# Patient Record
Sex: Male | Born: 1960 | Race: White | Hispanic: No | Marital: Married | Smoking: Never smoker
Health system: Southern US, Community
[De-identification: ages and names within clinical notes are randomized; demographics above are authoritative.]

## PROBLEM LIST (undated history)

## (undated) DIAGNOSIS — J309 Allergic rhinitis, unspecified: Secondary | ICD-10-CM

## (undated) DIAGNOSIS — M720 Palmar fascial fibromatosis [Dupuytren]: Secondary | ICD-10-CM

## (undated) HISTORY — DX: Allergic rhinitis, unspecified: J30.9

## (undated) HISTORY — DX: Palmar fascial fibromatosis (dupuytren): M72.0

## (undated) HISTORY — PX: KNEE ARTHROSCOPY: SUR90

## (undated) HISTORY — PX: ETHMOIDECTOMY: SHX5197

---

## 2002-11-03 ENCOUNTER — Ambulatory Visit (HOSPITAL_COMMUNITY): Admission: RE | Admit: 2002-11-03 | Discharge: 2002-11-03 | Payer: Self-pay | Admitting: Specialist

## 2002-11-03 ENCOUNTER — Encounter: Payer: Self-pay | Admitting: Specialist

## 2004-12-05 ENCOUNTER — Ambulatory Visit: Payer: Self-pay | Admitting: Unknown Physician Specialty

## 2008-02-02 ENCOUNTER — Ambulatory Visit: Payer: Self-pay | Admitting: Gastroenterology

## 2009-12-10 ENCOUNTER — Ambulatory Visit: Payer: Self-pay | Admitting: Otolaryngology

## 2016-05-12 DIAGNOSIS — J309 Allergic rhinitis, unspecified: Secondary | ICD-10-CM | POA: Insufficient documentation

## 2016-07-15 ENCOUNTER — Telehealth: Payer: Self-pay | Admitting: Family Medicine

## 2016-07-15 NOTE — Telephone Encounter (Signed)
Ok to reestablish: prefer a 45 minute appointment slot

## 2016-07-15 NOTE — Telephone Encounter (Signed)
Please review. KW 

## 2016-07-15 NOTE — Telephone Encounter (Signed)
Okay to re-establish patient

## 2016-07-15 NOTE — Telephone Encounter (Signed)
Spoke with pt and scheduled appt. Thanks TNP

## 2016-07-15 NOTE — Telephone Encounter (Signed)
Pt has not been seen since 03/31/2013.  Pt is having pain in his neck and right arm.  No Rx.  [facility].  Pt has not been seen by any other doctor other than an ENT doctor.  Will you reestablish this pt?  [id number]

## 2016-07-20 ENCOUNTER — Ambulatory Visit
Admission: RE | Admit: 2016-07-20 | Discharge: 2016-07-20 | Disposition: A | Discharge status: Home / Self Care | Payer: 59 | Source: Ambulatory Visit | Attending: Family Medicine | Admitting: Family Medicine

## 2016-07-20 ENCOUNTER — Encounter: Payer: Self-pay | Admitting: Family Medicine

## 2016-07-20 ENCOUNTER — Ambulatory Visit (INDEPENDENT_AMBULATORY_CARE_PROVIDER_SITE_OTHER): Payer: 59 | Admitting: Family Medicine

## 2016-07-20 ENCOUNTER — Telehealth: Payer: Self-pay

## 2016-07-20 VITALS — BP 106/70 | HR 68 | Temp 99.2°F | Resp 15 | Ht 69.0 in | Wt 182.0 lb

## 2016-07-20 DIAGNOSIS — M542 Cervicalgia: Secondary | ICD-10-CM

## 2016-07-20 DIAGNOSIS — M47812 Spondylosis without myelopathy or radiculopathy, cervical region: Secondary | ICD-10-CM | POA: Insufficient documentation

## 2016-07-20 DIAGNOSIS — Z23 Encounter for immunization: Secondary | ICD-10-CM

## 2016-07-20 MED ORDER — MELOXICAM 15 MG PO TABS
15.0000 mg | ORAL_TABLET | Freq: Every day | ORAL | 0 refills | Status: DC
Start: 2016-07-20 — End: 2017-06-14

## 2016-07-20 MED ORDER — CYCLOBENZAPRINE HCL 5 MG PO TABS: 5.0000 mg | ORAL_TABLET | Freq: Three times a day (TID) | ORAL | 1 refills | Status: DC | PRN

## 2016-07-20 NOTE — Telephone Encounter (Signed)
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 Would try prescribed medication for 1-2 weeks. If not improving call for orthopedic referral.

## 2016-07-20 NOTE — Patient Instructions (Signed)
Discussed use of warm compresses for 20 minutes 2 x day. Further f/u pending x-ray results

## 2016-07-20 NOTE — Telephone Encounter (Signed)
[id number] 

## 2016-07-20 NOTE — Progress Notes (Signed)
Subjective:     Patient ID: Derrick Romero, male   DOB: Oct 19, 1961, 55 y.o.   MRN: [id number]  HPI  Chief Complaint  Patient presents with  . Establish Care    Patient comes in office today to re-establish patient care, patient reports that during his absence from the practice he has not had a PCP. Patient would like to address stiff neck and dull sometimes sharp pain on his right arm, patient states that he has difficulties doing simple task such as tying his shoes.   States pain radiates to his first two fingers. Denies injury or Falls. Does mow his lawn with a push mower. Previously working out with weights and walking but stopped a month prior to onset of his sx.   Review of Systems     Objective:   Physical Exam  Constitutional: He appears well-developed and well-nourished. No distress.  Musculoskeletal:  Grips and right shoulder strength 5/5. Shoulder with FROM. Cervical ROM limited in extension and right lateral movement. No specific areas of tenderness but localizes to his right posterior cervical area.       Assessment:    1. Need for influenza vaccination - Flu Vaccine QUAD 36+ mos IM  2. Neck pain on right side - DG Cervical Spine Complete; Future - meloxicam (MOBIC) 15 MG tablet; Take 1 tablet (15 mg total) by mouth daily.  Dispense: 30 tablet; Refill: 0 - cyclobenzaprine (FLEXERIL) 5 MG tablet; Take 1 tablet (5 mg total) by mouth 3 (three) times daily as needed for muscle spasms.  Dispense: 30 tablet; Refill: 1    Plan:    Discussed use of warm compresses.

## 2016-07-22 NOTE — Telephone Encounter (Signed)
Patient has been advised. KW 

## 2017-06-14 ENCOUNTER — Other Ambulatory Visit: Payer: Self-pay | Admitting: Family Medicine

## 2017-06-14 ENCOUNTER — Encounter: Payer: Self-pay | Admitting: Family Medicine

## 2017-06-14 ENCOUNTER — Ambulatory Visit (INDEPENDENT_AMBULATORY_CARE_PROVIDER_SITE_OTHER): Payer: 59 | Admitting: Family Medicine

## 2017-06-14 VITALS — BP 140/80 | HR 57 | Temp 98.1°F | Resp 16 | Wt 204.8 lb

## 2017-06-14 DIAGNOSIS — R05 Cough: Secondary | ICD-10-CM | POA: Diagnosis not present

## 2017-06-14 DIAGNOSIS — R053 Chronic cough: Secondary | ICD-10-CM

## 2017-06-14 NOTE — Patient Instructions (Addendum)
Discussed taking Prevacid 30 mg. daily for two weeks. Phone follow up ok if you are doing well.

## 2017-06-14 NOTE — Progress Notes (Signed)
Subjective:     Patient ID: Derrick Romero, male   DOB: [birth year], 56 y.o.   MRN: [id number]  HPI  Chief Complaint  Patient presents with  . Cough    Patient comes in office today with complaint of non productive cough for the past 3 months. Patient reports difficulty breathing when taking deep breaths but denies any other URI symptoms. Patient has been taking otc antihistamine for cough.   States allergies are under control and not bothering him. Reports being on vacation last week with increase in heartburn. States cough is non-productive. No hx of smoking or asthma.   Review of Systems     Objective:   Physical Exam  Constitutional: He appears well-developed and well-nourished. No distress.  Cardiovascular: Normal rate and regular rhythm.   Pulmonary/Chest: Breath sounds normal. He has no wheezes.  Musculoskeletal: He exhibits no edema (of lower extremities).       Assessment:    1. Chronic cough: ? Mediated by reflux     Plan:    Discussed use of Prevacid 30 mg.daily. F/u in two weeks if not improved for further evaluation.

## 2017-06-28 ENCOUNTER — Ambulatory Visit
Admission: RE | Admit: 2017-06-28 | Discharge: 2017-06-28 | Disposition: A | Discharge status: Home / Self Care | Payer: 59 | Source: Ambulatory Visit | Attending: Family Medicine | Admitting: Family Medicine

## 2017-06-28 ENCOUNTER — Ambulatory Visit (INDEPENDENT_AMBULATORY_CARE_PROVIDER_SITE_OTHER): Payer: 59 | Admitting: Family Medicine

## 2017-06-28 ENCOUNTER — Encounter: Payer: Self-pay | Admitting: Family Medicine

## 2017-06-28 ENCOUNTER — Telehealth: Payer: Self-pay

## 2017-06-28 VITALS — BP 142/84 | HR 60 | Temp 98.8°F | Resp 16 | Wt 204.0 lb

## 2017-06-28 DIAGNOSIS — R05 Cough: Secondary | ICD-10-CM

## 2017-06-28 DIAGNOSIS — R053 Chronic cough: Secondary | ICD-10-CM

## 2017-06-28 MED ORDER — ALBUTEROL SULFATE HFA 108 (90 BASE) MCG/ACT IN AERS: 2.0000 | INHALATION_SPRAY | Freq: Four times a day (QID) | RESPIRATORY_TRACT | 2 refills | Status: DC | PRN

## 2017-06-28 MED ORDER — AZITHROMYCIN 250 MG PO TABS: ORAL_TABLET | ORAL | 0 refills | Status: DC

## 2017-06-28 NOTE — Telephone Encounter (Signed)
-----   Message from Carmon Ginsberg, [place] sent at 06/28/2017  9:13 AM EDT ----- Chest-x-ray clear. Proceed with plan discussed in the office.

## 2017-06-28 NOTE — Progress Notes (Signed)
Subjective:     Patient ID: Derrick Romero, male   DOB: October 15, 1961, 56 y.o.   MRN: [id number]  HPI  Chief Complaint  Patient presents with  . Cough    Patient returns to office today for two week follow up last office visit was 06/14/17. At last visit patient was advised to start Prevacid 30mg , patient reports good compliance on medication but states that the cough has not improved. Patient reports shortness of breath and wheezing.   States cough continues to be intermittent, minimally productive, and not associated with fever, chills, or night sweats. Reports paroxysms of coughing particularly first thing in the AM   Review of Systems     Objective:   Physical Exam  Constitutional: He appears well-developed and well-nourished. No distress.  Cardiovascular: Normal rate and regular rhythm.   Pulmonary/Chest: He has no wheezes.  Inspiratory pop heard  Musculoskeletal: He exhibits no edema (of lower extremities).       Assessment:    1. Chronic cough: no improvement with PPI - DG Chest 2 View; Future - azithromycin (ZITHROMAX Z-PAK) 250 MG tablet; 2 pills the first day then one pill daily  Dispense: 6 each; Refill: 0 - albuterol (PROVENTIL HFA;VENTOLIN HFA) 108 (90 Base) MCG/ACT inhaler; Inhale 2 puffs into the lungs every 6 (six) hours as needed for wheezing or shortness of breath.  Dispense: 1 Inhaler; Refill: 2    Plan:    Further f/u pending x-ray report.

## 2017-06-28 NOTE — Telephone Encounter (Signed)
Patient advised.KW 

## 2017-06-28 NOTE — Patient Instructions (Signed)
We will call you with the x-ray report 

## 2018-01-24 ENCOUNTER — Ambulatory Visit (INDEPENDENT_AMBULATORY_CARE_PROVIDER_SITE_OTHER): Payer: Managed Care, Other (non HMO) | Admitting: Family Medicine

## 2018-01-24 ENCOUNTER — Other Ambulatory Visit: Payer: Self-pay | Admitting: Family Medicine

## 2018-01-24 ENCOUNTER — Encounter: Payer: Self-pay | Admitting: Family Medicine

## 2018-01-24 VITALS — BP 142/100 | HR 64 | Temp 98.8°F | Resp 15 | Wt 198.8 lb

## 2018-01-24 DIAGNOSIS — R1010 Upper abdominal pain, unspecified: Secondary | ICD-10-CM | POA: Diagnosis not present

## 2018-01-24 DIAGNOSIS — R195 Other fecal abnormalities: Secondary | ICD-10-CM

## 2018-01-24 LAB — HEMOCCULT GUIAC POC 1CARD (OFFICE): Fecal Occult Blood, POC: NEGATIVE

## 2018-01-24 NOTE — Progress Notes (Signed)
Subjective:     Patient ID: Derrick Romero, male   DOB: 12-31-1960, 57 y.o.   MRN: [id number] Chief Complaint  Patient presents with  . Diarrhea    Patient comes in office with complaint of loose stools for the past two weeks, patient denies traveling or eating different foods or being around with anyone with similar symptoms. Patient reports that he has had generalized abdominal pain which he describes a dull ache and has noticed that urine has strong odor. Patient states that abdominal pain is more noticable after eating, he denies taking any otc medication to help with symptoms.    HPI States his stools are loose but not watery every AM for > 2 weeks. Reports his normal pattern is one formed stool daily. No recent abx use, travel, otc medication, or stress. Drinks 2 cups of coffee in the AM. Denies family hx of colon cancer or IBD. States he does get upper abdominal pain for 30 minutes after some meals but is normally eating low fat food choices. Abdominal pain usually separate from his loose stools. Had normal colonoscopy in 2009.  Review of Systems     Objective:   Physical Exam  Constitutional: He appears well-developed and well-nourished. No distress.  Abdominal: Soft. There is no tenderness. There is no guarding.  Genitourinary: Prostate normal. Rectal exam shows guaiac negative stool.       Assessment:    1. Pain of upper abdomen - POCT occult blood stool - US Abdomen Complete; Future  2. Loose stools - POCT occult blood stool    Plan:    Evaluate for gall stones. Consider referral to Dr.Wohl as needed.

## 2018-01-24 NOTE — Patient Instructions (Signed)
We will call you with the ultrasound scheduling.

## 2018-01-31 ENCOUNTER — Ambulatory Visit
Admission: RE | Admit: 2018-01-31 | Discharge: 2018-01-31 | Disposition: A | Discharge status: Home / Self Care | Payer: Managed Care, Other (non HMO) | Source: Ambulatory Visit | Attending: Family Medicine | Admitting: Family Medicine

## 2018-01-31 ENCOUNTER — Other Ambulatory Visit: Payer: Self-pay | Admitting: Family Medicine

## 2018-01-31 ENCOUNTER — Telehealth: Payer: Self-pay

## 2018-01-31 DIAGNOSIS — K760 Fatty (change of) liver, not elsewhere classified: Secondary | ICD-10-CM | POA: Insufficient documentation

## 2018-01-31 DIAGNOSIS — R1010 Upper abdominal pain, unspecified: Secondary | ICD-10-CM | POA: Diagnosis not present

## 2018-01-31 DIAGNOSIS — R1011 Right upper quadrant pain: Secondary | ICD-10-CM

## 2018-01-31 DIAGNOSIS — R1012 Left upper quadrant pain: Principal | ICD-10-CM

## 2018-01-31 NOTE — Telephone Encounter (Signed)
Patient advised he states that he will get labs drawn tomorrow. KW

## 2018-01-31 NOTE — Telephone Encounter (Signed)
Patient advised he states that he still has mid abdominal pain that comes and goes and describes this as cramping/pressure feeling in middle of abdomen, patient denies nausea or vomiting. KW

## 2018-01-31 NOTE — Telephone Encounter (Signed)
-----   Message from Derrick Romero, [place] sent at 01/31/2018 10:34 AM EDT ----- Ultrasound ok. No gallstones. Is he still having abdominal pain?

## 2018-01-31 NOTE — Telephone Encounter (Signed)
I have ordered labs to further assess your abdominal pain. Please get them this week at your convenience.

## 2018-02-02 LAB — CBC WITH DIFFERENTIAL/PLATELET
BASOS ABS: 0 10*3/uL (ref 0.0–0.2)
Basos: 1 %
EOS (ABSOLUTE): 0.3 10*3/uL (ref 0.0–0.4)
Eos: 4 %
Hematocrit: 44.3 % (ref 37.5–51.0)
Hemoglobin: 15.1 g/dL (ref 13.0–17.7)
IMMATURE GRANULOCYTES: 0 %
Immature Grans (Abs): 0 10*3/uL (ref 0.0–0.1)
Lymphocytes Absolute: 2.5 10*3/uL (ref 0.7–3.1)
Lymphs: 34 %
MCH: 30.4 pg (ref 26.6–33.0)
MCHC: 34.1 g/dL (ref 31.5–35.7)
MCV: 89 fL (ref 79–97)
MONOS ABS: 0.5 10*3/uL (ref 0.1–0.9)
Monocytes: 8 %
NEUTROS PCT: 53 %
Neutrophils Absolute: 3.9 10*3/uL (ref 1.4–7.0)
PLATELETS: 263 10*3/uL (ref 150–379)
RBC: 4.96 x10E6/uL (ref 4.14–5.80)
RDW: 13.5 % (ref 12.3–15.4)
WBC: 7.2 10*3/uL (ref 3.4–10.8)

## 2018-02-10 ENCOUNTER — Telehealth: Payer: Self-pay | Admitting: Family Medicine

## 2018-02-10 NOTE — Telephone Encounter (Signed)
Patient advised that he needs to have labs drawn, printed out requisitions and placed them  Up front for pick up, patient plans on getting labs drawn this afternoon. KW

## 2018-02-10 NOTE — Telephone Encounter (Signed)
Per [facility] rep patient only turned in one lab requisition for the CBC, will contact patient to get labs drawn . KW

## 2018-02-10 NOTE — Telephone Encounter (Signed)
Patient is wanting lab results from last weeks blood test.

## 2018-02-10 NOTE — Telephone Encounter (Signed)
I see the CBC but no other labs. Could we check with [facility] and see if they were done?

## 2018-02-11 ENCOUNTER — Telehealth: Payer: Self-pay

## 2018-02-11 LAB — COMPREHENSIVE METABOLIC PANEL
ALBUMIN: 4.6 g/dL (ref 3.5–5.5)
ALK PHOS: 66 IU/L (ref 39–117)
ALT: 43 IU/L (ref 0–44)
AST: 30 IU/L (ref 0–40)
Albumin/Globulin Ratio: 1.6 (ref 1.2–2.2)
BILIRUBIN TOTAL: 0.3 mg/dL (ref 0.0–1.2)
BUN / CREAT RATIO: 12 (ref 9–20)
BUN: 14 mg/dL (ref 6–24)
CHLORIDE: 102 mmol/L (ref 96–106)
CO2: 25 mmol/L (ref 20–29)
CREATININE: 1.17 mg/dL (ref 0.76–1.27)
Calcium: 9.9 mg/dL (ref 8.7–10.2)
GFR calc non Af Amer: 70 mL/min/{1.73_m2} (ref >59)
GFR, EST AFRICAN AMERICAN: 81 mL/min/{1.73_m2} (ref >59)
GLOBULIN, TOTAL: 2.9 g/dL (ref 1.5–4.5)
GLUCOSE: 99 mg/dL (ref 65–99)
Potassium: 4.3 mmol/L (ref 3.5–5.2)
SODIUM: 141 mmol/L (ref 134–144)
TOTAL PROTEIN: 7.5 g/dL (ref 6.0–8.5)

## 2018-02-11 LAB — LIPASE: LIPASE: 20 U/L (ref 13–78)

## 2018-02-11 NOTE — Telephone Encounter (Signed)
[id number] 

## 2018-02-11 NOTE — Telephone Encounter (Signed)
Pt returned call ° °teri °

## 2018-02-11 NOTE — Telephone Encounter (Signed)
Patient advised.KW 

## 2018-02-11 NOTE — Telephone Encounter (Signed)
-----   Message from Carmon Ginsberg, [place] sent at 02/11/2018  7:26 AM EDT ----- Liver and pancreas ok. Waiting on results of breath test.

## 2018-02-12 LAB — H. PYLORI BREATH TEST: H pylori Breath Test: NEGATIVE

## 2018-02-14 ENCOUNTER — Other Ambulatory Visit: Payer: Self-pay | Admitting: Family Medicine

## 2018-02-14 ENCOUNTER — Telehealth: Payer: Self-pay | Admitting: Emergency Medicine

## 2018-02-14 DIAGNOSIS — R1010 Upper abdominal pain, unspecified: Secondary | ICD-10-CM

## 2018-02-14 NOTE — Telephone Encounter (Signed)
-----   Message from Carmon Ginsberg, [place] sent at 02/14/2018  7:28 AM EDT ----- Breath test for detecting ulcers is negative. If still having the abdominal discomfort would refer you to a G.I. Specialist. Do you wish to proceed?

## 2018-02-14 NOTE — Telephone Encounter (Signed)
Pt advised. He reports that he is not feeling any better and would like to be referred to GI.

## 2018-02-14 NOTE — Telephone Encounter (Signed)
Referral in progress. 

## 2018-02-16 ENCOUNTER — Encounter: Payer: Self-pay | Admitting: Gastroenterology

## 2018-06-21 ENCOUNTER — Encounter: Payer: Self-pay | Admitting: Family Medicine

## 2018-06-21 ENCOUNTER — Ambulatory Visit: Payer: Managed Care, Other (non HMO) | Admitting: Family Medicine

## 2018-06-21 VITALS — BP 110/70 | HR 68 | Temp 98.5°F | Resp 16 | Wt 198.8 lb

## 2018-06-21 DIAGNOSIS — H6122 Impacted cerumen, left ear: Secondary | ICD-10-CM | POA: Diagnosis not present

## 2018-06-21 DIAGNOSIS — J301 Allergic rhinitis due to pollen: Secondary | ICD-10-CM

## 2018-06-21 NOTE — Progress Notes (Signed)
  Subjective:     Patient ID: Derrick Romero, male   DOB: 03/31/1961, 57 y.o.   MRN: [id number] Chief Complaint  Patient presents with  . Hearing Problem    Patient comes in office today with reports of ear congestion in his left ear for the past two weeks. Patient reports symptoms of post nasal drop, sinus pain/pressure. Patient reports placing wax softner in ear and taking otc allergy pill and nasal spray for relief.    HPI States he has been treating his allergies with multiple medications including steroid nasal spray with little improvement in his left ear congestion. Reports sinus drainage is primarily clear. States his ear got worse two week ago when he dove into the water while at the beach.  Review of Systems     Objective:   Physical Exam  Constitutional: He appears well-developed and well-nourished. No distress.  Ears: R TM intact without inflammation. Left ear canal with a cerumen impaction. After irrigation per Kat: Ear canal is patent and T.M is intact. Old scarring is noted. Throat: no tonsillar enlargement or exudate Neck: no cervical adenopathy Lungs: clear     Assessment:    1. Seasonal allergic rhinitis due to pollen  2. Cerumen debris on tympanic membrane of left ear: resolved - EAR CERUMEN REMOVAL    Plan:    Further f/u f/u prn purulent sinus drainage or increased pressure.

## 2018-06-21 NOTE — Patient Instructions (Signed)
Let me know if you develop increased yellow or green sinus drainage/pressure despite use of allergy medication as you are doing.

## 2018-07-28 ENCOUNTER — Ambulatory Visit (INDEPENDENT_AMBULATORY_CARE_PROVIDER_SITE_OTHER): Payer: Managed Care, Other (non HMO) | Admitting: Family Medicine

## 2018-07-28 ENCOUNTER — Encounter: Payer: Self-pay | Admitting: Family Medicine

## 2018-07-28 VITALS — BP 116/82 | HR 84 | Temp 98.0°F | Wt 193.2 lb

## 2018-07-28 DIAGNOSIS — J40 Bronchitis, not specified as acute or chronic: Secondary | ICD-10-CM

## 2018-07-28 DIAGNOSIS — M7020 Olecranon bursitis, unspecified elbow: Secondary | ICD-10-CM | POA: Insufficient documentation

## 2018-07-28 MED ORDER — PREDNISONE 20 MG PO TABS: 40.0000 mg | ORAL_TABLET | Freq: Every day | ORAL | 0 refills | Status: AC

## 2018-07-28 MED ORDER — AZITHROMYCIN 250 MG PO TABS: ORAL_TABLET | ORAL | 0 refills | Status: DC

## 2018-07-28 NOTE — Patient Instructions (Signed)

## 2018-07-28 NOTE — Progress Notes (Signed)
Patient: Derrick Romero Male    DOB: [birth year]   57 y.o.   MRN: [id number] Visit Date: 07/28/2018  Today's Provider: Lavon Paganini, MD   Chief Complaint  Patient presents with  . URI   Subjective:    I, Tiburcio Pea, CMA, am acting as a scribe for Lavon Paganini, MD.   URI   This is a new problem. Episode onset: 12 days ago. The problem has been unchanged. There has been no fever. Associated symptoms include congestion, coughing, headaches, rhinorrhea, sneezing and wheezing. Pertinent negatives include no sinus pain or sore throat. Associated symptoms comments: SOB . Treatments tried: Mucinex and Tylenol PM for sleep  The treatment provided mild relief.   Has been using albuterol inhaler 3-4 times daily that seems to knock the edge off.  He is most concerned about the shortness of breath, coughing, and wheezing.  It is been worsening over the last 12 days.  He has had bronchitis previously and states this feels similar.    No Known Allergies   Current Outpatient Medications:  .  albuterol (PROVENTIL HFA;VENTOLIN HFA) 108 (90 Base) MCG/ACT inhaler, Inhale 2 puffs into the lungs every 6 (six) hours as needed for wheezing or shortness of breath., Disp: 1 Inhaler, Rfl: 2 .  Misc Natural Products (GLUCOSAMINE CHONDROITIN ADV) TABS, Take by mouth., Disp: , Rfl:   Review of Systems  Constitutional: Negative.   HENT: Positive for congestion, rhinorrhea and sneezing. Negative for sinus pain and sore throat.   Respiratory: Positive for cough and wheezing.   Cardiovascular: Negative.   Musculoskeletal: Negative.   Neurological: Positive for headaches.    Social History   Tobacco Use  . Smoking status: Never Smoker  . Smokeless tobacco: Never Used  Substance Use Topics  . Alcohol use: Yes    Comment: occasional   Objective:   BP 116/82 (BP Location: Left Arm, Patient Position: Sitting, Cuff Size: Large)   Pulse 84   Temp 98 F (36.7 C) (Oral)   Wt 193 lb 3.2 oz  (87.6 kg)   SpO2 95%   BMI 28.53 kg/m  Vitals:   07/28/18 1049  BP: 116/82  Pulse: 84  Temp: 98 F (36.7 C)  TempSrc: Oral  SpO2: 95%  Weight: 193 lb 3.2 oz (87.6 kg)     Physical Exam  Constitutional: He is oriented to person, place, and time. He appears well-developed and well-nourished. No distress.  HENT:  Head: Normocephalic and atraumatic.  Right Ear: Tympanic membrane, external ear and ear canal normal.  Left Ear: Tympanic membrane, external ear and ear canal normal.  Nose: Mucosal edema present. Right sinus exhibits no maxillary sinus tenderness and no frontal sinus tenderness. Left sinus exhibits no maxillary sinus tenderness and no frontal sinus tenderness.  Mouth/Throat: Uvula is midline, oropharynx is clear and moist and mucous membranes are normal.  Eyes: Pupils are equal, round, and reactive to light. Conjunctivae are normal. Right eye exhibits no discharge. Left eye exhibits no discharge. No scleral icterus.  Neck: Neck supple. No thyromegaly present.  Cardiovascular: Normal rate, regular rhythm, normal heart sounds and intact distal pulses.  No murmur heard. Pulmonary/Chest: Effort normal. No respiratory distress. He has wheezes (diffusely). He has rales (worse in bases).  Musculoskeletal: He exhibits no edema.  Lymphadenopathy:    He has no cervical adenopathy.  Neurological: He is alert and oriented to person, place, and time.  Skin: Skin is warm and dry. Capillary refill takes less than  2 seconds. No rash noted.  Psychiatric: He has a normal mood and affect. His behavior is normal.  Vitals reviewed.       Assessment & Plan:   1. Bronchitis - Symptoms and exam consistent with bronchitis -Given that lung exam is symmetric, doubt pneumonia -No indication for chest x-ray at this time -We will treat with prednisone and azithromycin -Can continue albuterol as needed as well as over-the-counter medications for symptom management -Discussed strict return  precautions   Meds ordered this encounter  Medications  . azithromycin (ZITHROMAX) 250 MG tablet    Sig: Take 500mg  x1 dose, then take 250mg  daily x4 d    Dispense:  6 each    Refill:  0  . predniSONE (DELTASONE) 20 MG tablet    Sig: Take 2 tablets (40 mg total) by mouth daily with breakfast for 5 days.    Dispense:  10 tablet    Refill:  0     Return if symptoms worsen or fail to improve.   The entirety of the information documented in the History of Present Illness, Review of Systems and Physical Exam were personally obtained by me. Portions of this information were initially documented by Tiburcio Pea, CMA and reviewed by me for thoroughness and accuracy.    Virginia Crews, MD, MPH [facility] 07/28/2018 11:06 AM

## 2018-12-26 ENCOUNTER — Ambulatory Visit: Payer: Managed Care, Other (non HMO) | Admitting: Family Medicine

## 2018-12-26 ENCOUNTER — Encounter: Payer: Self-pay | Admitting: Family Medicine

## 2018-12-26 VITALS — BP 120/84 | HR 91 | Temp 98.6°F | Resp 15 | Wt 171.2 lb

## 2018-12-26 DIAGNOSIS — Z1211 Encounter for screening for malignant neoplasm of colon: Secondary | ICD-10-CM

## 2018-12-26 DIAGNOSIS — Z13220 Encounter for screening for lipoid disorders: Secondary | ICD-10-CM

## 2018-12-26 DIAGNOSIS — R350 Frequency of micturition: Secondary | ICD-10-CM

## 2018-12-26 DIAGNOSIS — R6882 Decreased libido: Secondary | ICD-10-CM | POA: Diagnosis not present

## 2018-12-26 DIAGNOSIS — J309 Allergic rhinitis, unspecified: Secondary | ICD-10-CM

## 2018-12-26 DIAGNOSIS — Z131 Encounter for screening for diabetes mellitus: Secondary | ICD-10-CM | POA: Diagnosis not present

## 2018-12-26 DIAGNOSIS — Z1159 Encounter for screening for other viral diseases: Secondary | ICD-10-CM | POA: Diagnosis not present

## 2018-12-26 DIAGNOSIS — L301 Dyshidrosis [pompholyx]: Secondary | ICD-10-CM

## 2018-12-26 DIAGNOSIS — Z125 Encounter for screening for malignant neoplasm of prostate: Secondary | ICD-10-CM

## 2018-12-26 LAB — POCT URINALYSIS DIPSTICK
Bilirubin, UA: NEGATIVE
Blood, UA: NEGATIVE
Glucose, UA: NEGATIVE
Ketones, UA: NEGATIVE
LEUKOCYTES UA: NEGATIVE
NITRITE UA: NEGATIVE
PROTEIN UA: NEGATIVE
Spec Grav, UA: <=1.005 — AB (ref 1.010–1.025)
Urobilinogen, UA: NEGATIVE E.U./dL — AB
pH, UA: 6.5 (ref 5.0–8.0)

## 2018-12-26 LAB — POCT GLYCOSYLATED HEMOGLOBIN (HGB A1C): HEMOGLOBIN A1C: 6 % — AB (ref 4.0–5.6)

## 2018-12-26 NOTE — Patient Instructions (Addendum)
We will call you with the lab results and colonoscopy. Do get the flu shot and update your tetanus shot (Td in 2007). Discussed using moisturizing cream mixed in equal amounts with hydrocortisone cream daily for your hands.

## 2018-12-26 NOTE — Progress Notes (Signed)
  Subjective:     Patient ID: Derrick Romero, male   DOB: 04-07-1961, 58 y.o.   MRN: [id number] Chief Complaint  Patient presents with  . Urinary Frequency    Patient comes in office today with complaints of urinary frequency for the past 3 weeks. Paitent reportes that he has noticed signs of increased thirst and back pain.    HPI After further history he has nocturia x one. Difficult for him to stay on subject and needs to be redirected. Basically we have decided to perform health maintenance labs and will update vaccinations at a later date after he gets his  Dupuytren's injections. Currently works as an Lobbyist.  Review of Systems  HENT:       States he currently is controlling his allergies with Flonase, allegra, and saline irrigations.  Genitourinary:       Reported decreased libido but no erectile dysfunction  Skin:       Has dry skin with fissuring on his fingers  Psychiatric/Behavioral: Positive for sleep disturbance (takes benadryl at times to help him sleep).       Objective:   Physical Exam Constitutional:      General: He is not in acute distress. Cardiovascular:     Rate and Rhythm: Normal rate and regular rhythm.  Pulmonary:     Breath sounds: Wheezes:  transient wheezes in his posterior lung fields.  Neurological:     Mental Status: He is alert.        Assessment:    1. Urinary frequency - POCT urinalysis dipstick - POCT glycosylated hemoglobin (Hb A1C)  2. Decreased libido - Testosterone  3. Screening for diabetes mellitus - Comprehensive metabolic panel  4. Encounter for hepatitis C screening test for low risk patient - Hepatitis C Antibody  5. Screening for prostate cancer - PSA  6. Allergic rhinitis, unspecified seasonality, unspecified trigger  7. Screening for colon cancer - Ambulatory referral to Gastroenterology  8. Screening for cholesterol level - Lipid panel  9. Dyshidrotic eczema    Plan:    Further f/u pending lab  results. Update flu and Tetanus shot. Use moisturizing cream mixed with hydrocortisone cream for his eczema.

## 2018-12-27 ENCOUNTER — Other Ambulatory Visit: Payer: Self-pay | Admitting: Family Medicine

## 2018-12-28 ENCOUNTER — Other Ambulatory Visit: Payer: Self-pay

## 2018-12-28 ENCOUNTER — Telehealth: Payer: Self-pay | Admitting: Gastroenterology

## 2018-12-28 DIAGNOSIS — Z1211 Encounter for screening for malignant neoplasm of colon: Secondary | ICD-10-CM

## 2018-12-28 LAB — COMPREHENSIVE METABOLIC PANEL
ALK PHOS: 60 IU/L (ref 39–117)
ALT: 39 IU/L (ref 0–44)
AST: 50 IU/L — ABNORMAL HIGH (ref 0–40)
Albumin/Globulin Ratio: 1.6 (ref 1.2–2.2)
Albumin: 4.5 g/dL (ref 3.8–4.9)
BUN/Creatinine Ratio: 16 (ref 9–20)
BUN: 17 mg/dL (ref 6–24)
Bilirubin Total: 0.6 mg/dL (ref 0.0–1.2)
CO2: 22 mmol/L (ref 20–29)
Calcium: 9.7 mg/dL (ref 8.7–10.2)
Chloride: 102 mmol/L (ref 96–106)
Creatinine, Ser: 1.07 mg/dL (ref 0.76–1.27)
GFR calc Af Amer: 90 mL/min/{1.73_m2} (ref >59)
GFR calc non Af Amer: 78 mL/min/{1.73_m2} (ref >59)
Globulin, Total: 2.9 g/dL (ref 1.5–4.5)
Glucose: 107 mg/dL — ABNORMAL HIGH (ref 65–99)
POTASSIUM: 4.6 mmol/L (ref 3.5–5.2)
Sodium: 138 mmol/L (ref 134–144)
Total Protein: 7.4 g/dL (ref 6.0–8.5)

## 2018-12-28 LAB — HEPATITIS C ANTIBODY: Hep C Virus Ab: <0.1 s/co ratio (ref 0.0–0.9)

## 2018-12-28 LAB — PSA: PROSTATE SPECIFIC AG, SERUM: 1.2 ng/mL (ref 0.0–4.0)

## 2018-12-28 LAB — LIPID PANEL
Chol/HDL Ratio: 3.7 ratio (ref 0.0–5.0)
Cholesterol, Total: 219 mg/dL — ABNORMAL HIGH (ref 100–199)
HDL: 59 mg/dL (ref >39)
LDL Calculated: 143 mg/dL — ABNORMAL HIGH (ref 0–99)
Triglycerides: 87 mg/dL (ref 0–149)
VLDL Cholesterol Cal: 17 mg/dL (ref 5–40)

## 2018-12-28 LAB — TESTOSTERONE: Testosterone: 637 ng/dL (ref 264–916)

## 2018-12-28 NOTE — Telephone Encounter (Signed)
LVM for pt returning call to schedule colonoscopy.  Thanks [employer]

## 2018-12-28 NOTE — Telephone Encounter (Signed)
Pt returned a call to schedule procedure

## 2019-01-01 IMAGING — US US ABDOMEN COMPLETE
2 series · 14 of 25 positions shown · non-contrast
Comparison: None.

CLINICAL DATA: Abdominal pain for 1 month

EXAM:
ABDOMEN ULTRASOUND COMPLETE

[Series 1: us abdomen complete · 0.17mm/px · 13 of 91 slices shown (1 of 2)]
[im 1/91]
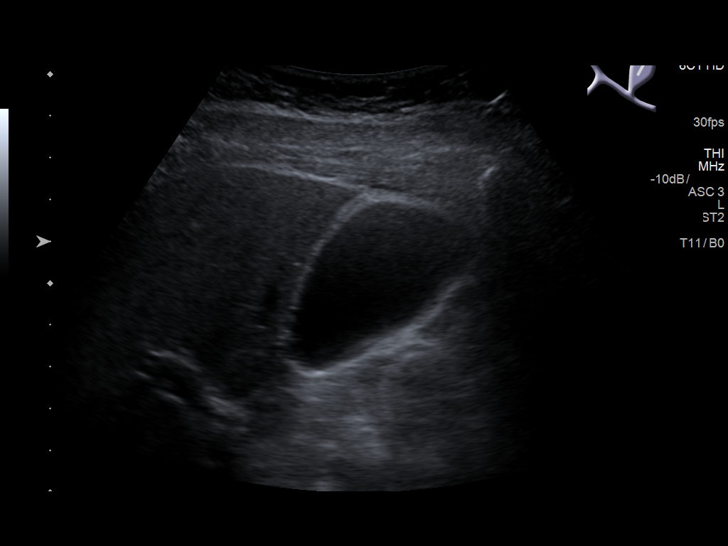
[im 8/91]
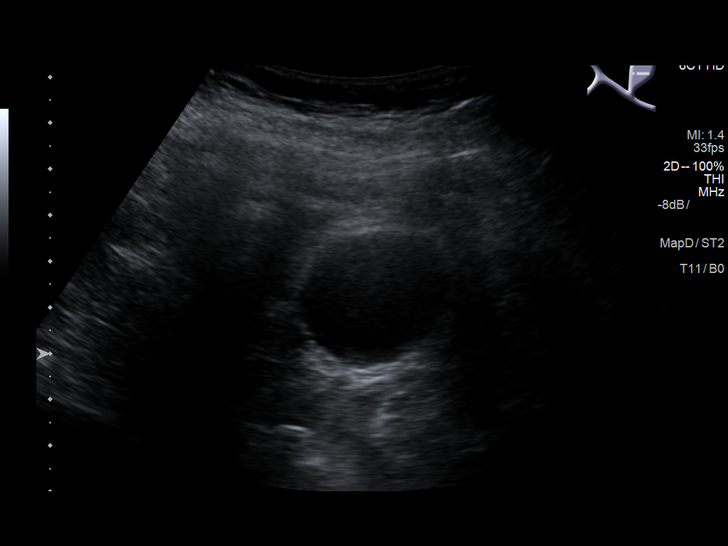
[im 16/91]
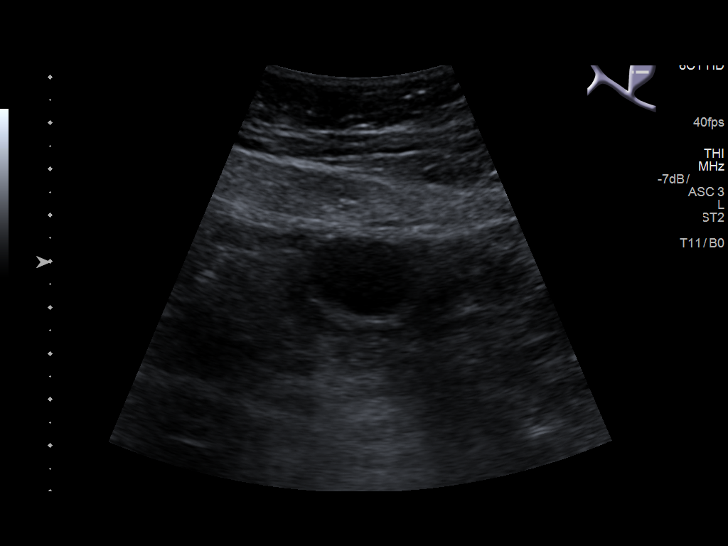
[im 24/91]
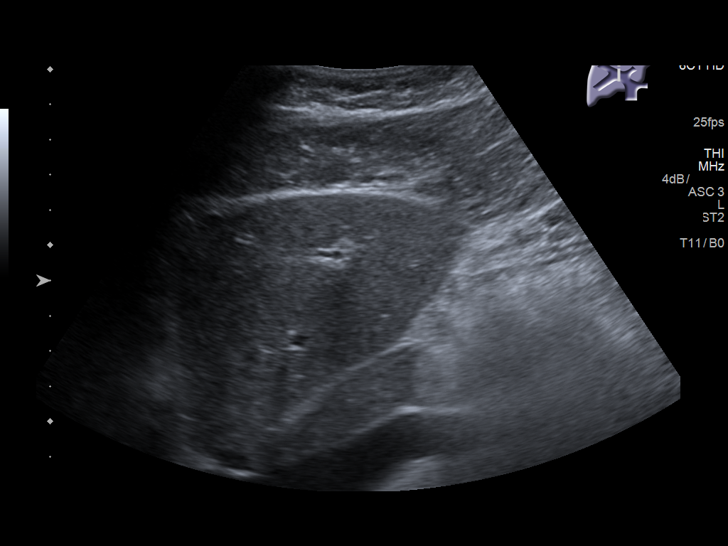
[im 32/91]
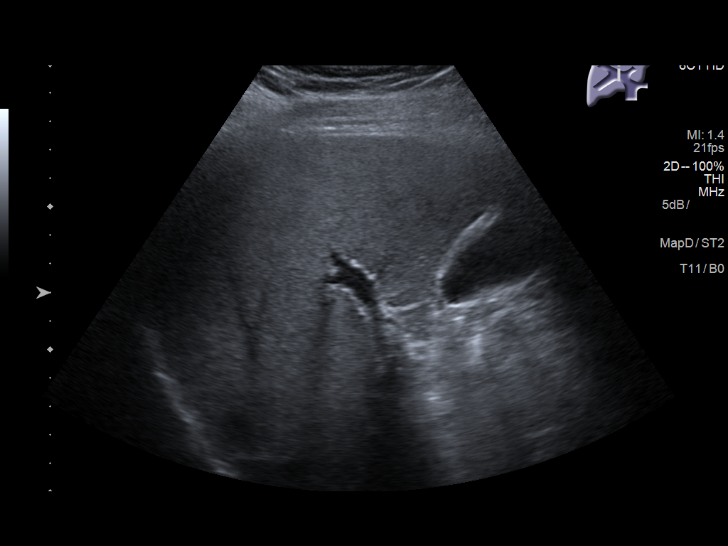
[im 36/91]
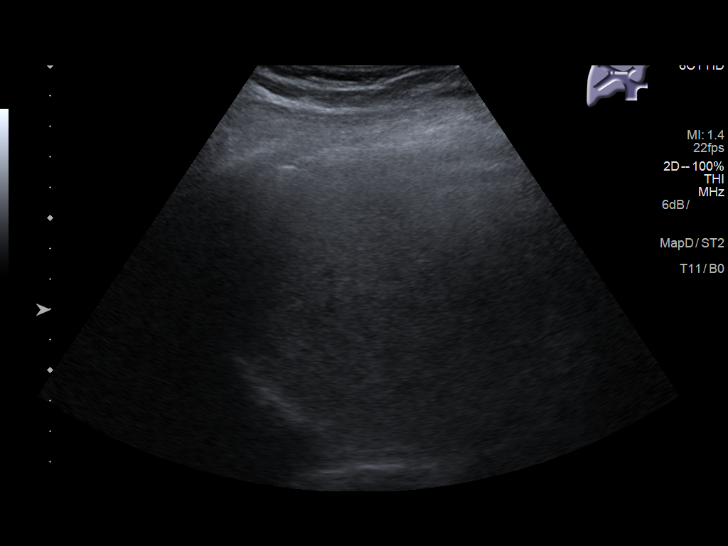
[im 44/91]
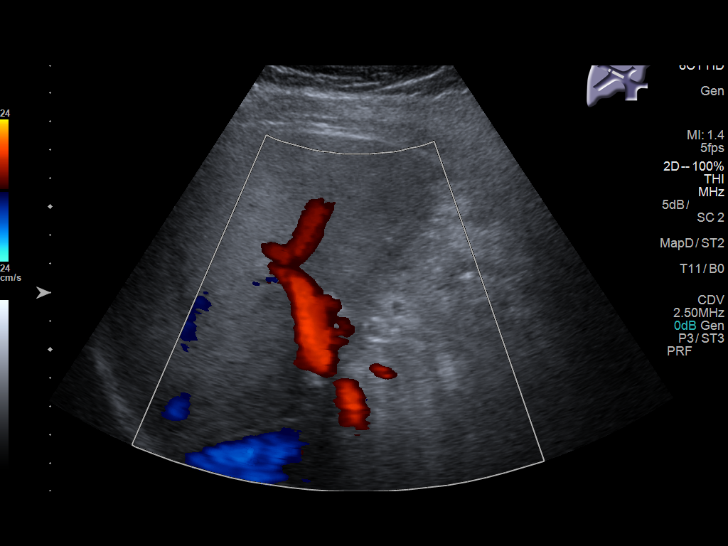
[im 51/91]
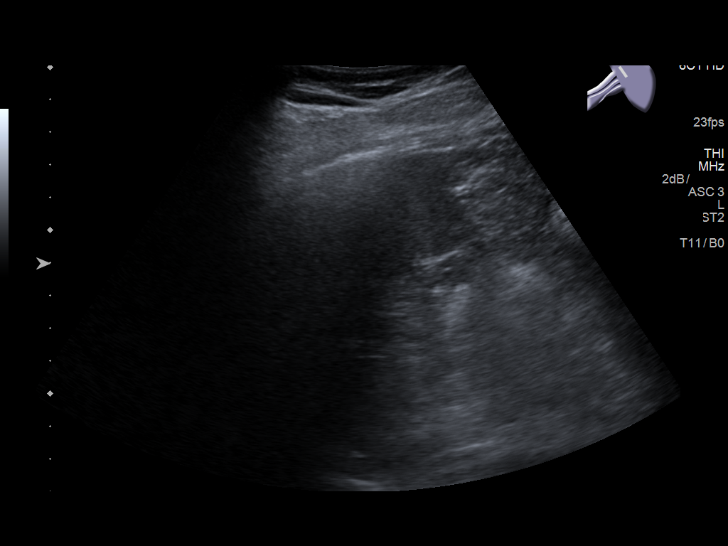
[im 59/91]
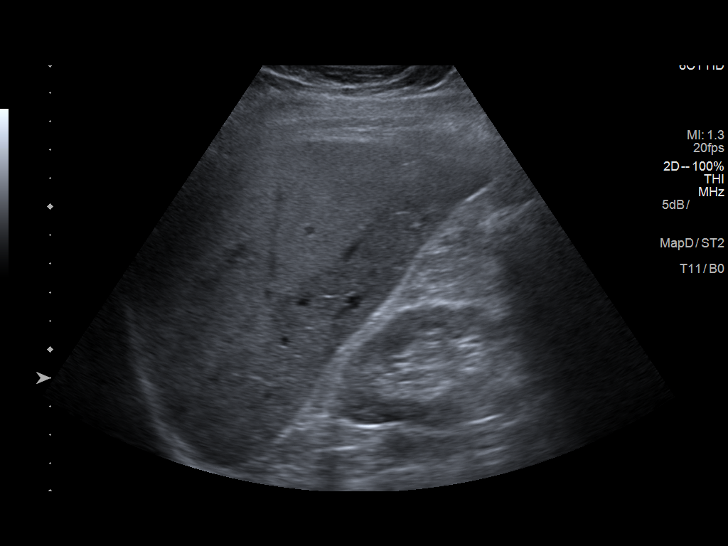
[im 63/91]
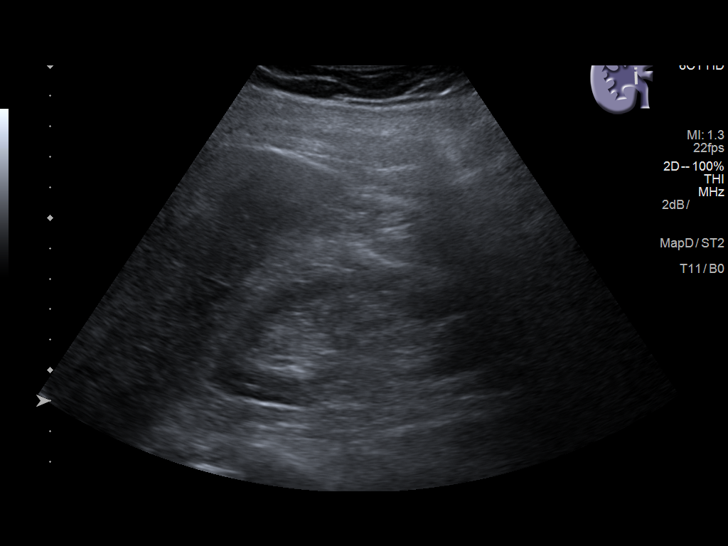
[im 71/91]
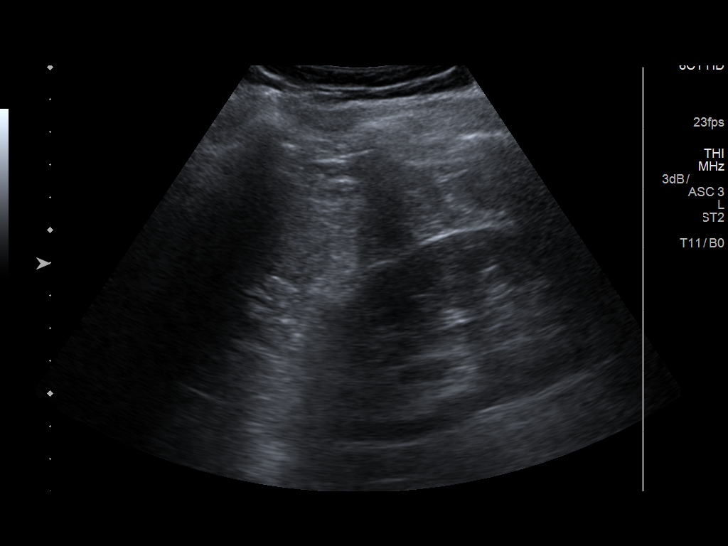
[im 79/91]
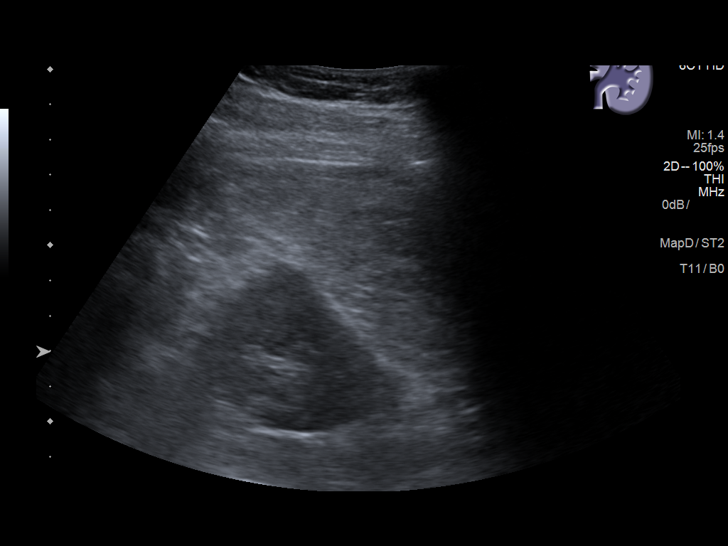
[im 87/91]
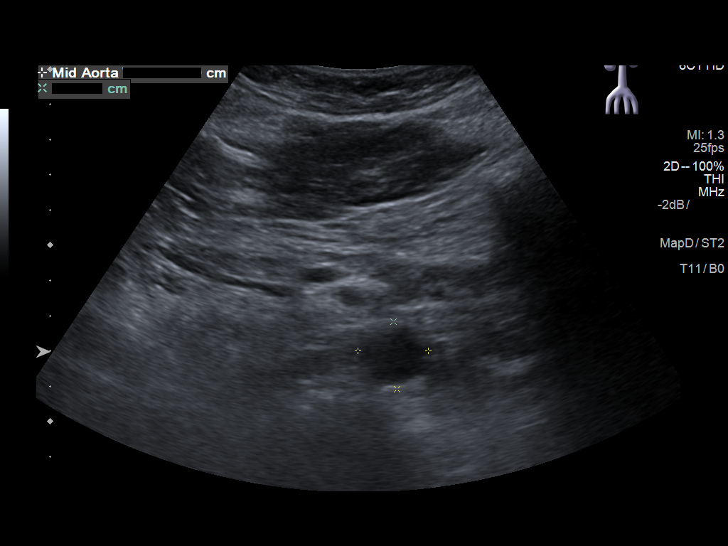

[Series 2001: us abdomen complete · 0.17mm/px · 1 of 1 slices shown (2 of 2)]
[im 1/1]
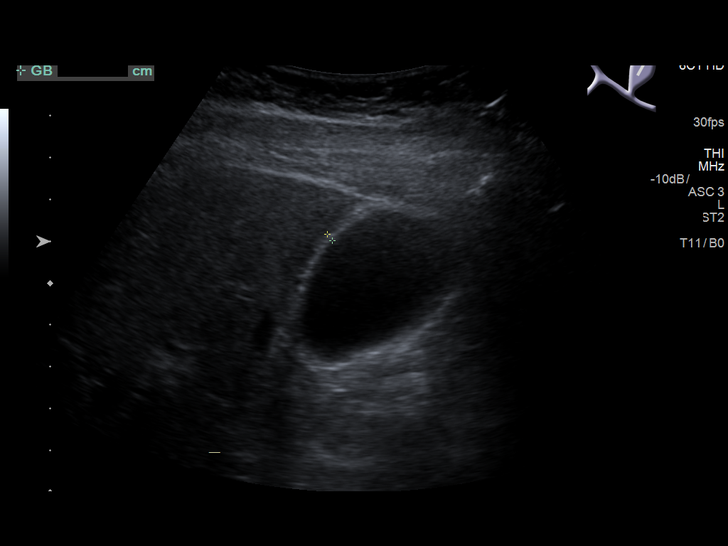

[14 of 25 positions shown; findings below may reference images not displayed]

FINDINGS: Gallbladder: No gallstones or wall thickening visualized. No
sonographic Murphy sign noted by sonographer.

Common bile duct: Diameter: 3.9 mm.

Liver: Mildly increased in echogenicity likely related to fatty
infiltration. No focal mass is noted. Portal vein is patent on color
Doppler imaging with normal direction of blood flow towards the
liver.

IVC: No abnormality visualized.

Pancreas: Visualized portion unremarkable.

Spleen: Size and appearance within normal limits.

Right Kidney: Length: 10.6 cm.. Echogenicity within normal limits.
No mass or hydronephrosis visualized.

Left Kidney: Length: 11.3 cm.. Echogenicity within normal limits. No
mass or hydronephrosis visualized.

Abdominal aorta: No aneurysm visualized.

Other findings: None.
IMPRESSION: Mild fatty infiltration.  No acute abnormality noted.

## 2019-01-12 ENCOUNTER — Ambulatory Visit
Admission: RE | Admit: 2019-01-12 | Discharge: 2019-01-12 | Disposition: A | Discharge status: Home / Self Care | Payer: Managed Care, Other (non HMO) | Attending: Gastroenterology | Admitting: Gastroenterology

## 2019-01-12 ENCOUNTER — Encounter
Admission: RE | Disposition: A | Discharge status: Home / Self Care | Payer: Self-pay | Source: Home / Self Care | Attending: Gastroenterology

## 2019-01-12 ENCOUNTER — Encounter: Payer: Self-pay | Admitting: Emergency Medicine

## 2019-01-12 ENCOUNTER — Ambulatory Visit: Payer: Managed Care, Other (non HMO) | Admitting: Anesthesiology

## 2019-01-12 DIAGNOSIS — D120 Benign neoplasm of cecum: Secondary | ICD-10-CM

## 2019-01-12 DIAGNOSIS — Z79899 Other long term (current) drug therapy: Secondary | ICD-10-CM | POA: Diagnosis not present

## 2019-01-12 DIAGNOSIS — Z1211 Encounter for screening for malignant neoplasm of colon: Secondary | ICD-10-CM | POA: Diagnosis not present

## 2019-01-12 DIAGNOSIS — D124 Benign neoplasm of descending colon: Secondary | ICD-10-CM

## 2019-01-12 HISTORY — PX: COLONOSCOPY WITH PROPOFOL: SHX5780

## 2019-01-12 SURGERY — COLONOSCOPY WITH PROPOFOL
Anesthesia: General

## 2019-01-12 MED ORDER — LIDOCAINE 2% (20 MG/ML) 5 ML SYRINGE
INTRAMUSCULAR | Status: DC | PRN
  Administered 2019-01-12: 30 mg via INTRAVENOUS

## 2019-01-12 MED ORDER — SODIUM CHLORIDE 0.9 % IV SOLN
INTRAVENOUS | Status: DC
  Administered 2019-01-12: 12:00:00 via INTRAVENOUS

## 2019-01-12 MED ORDER — LIDOCAINE HCL (PF) 2 % IJ SOLN
INTRAMUSCULAR | Status: AC
  Filled 2019-01-12: qty 20

## 2019-01-12 MED ORDER — FENTANYL CITRATE (PF) 100 MCG/2ML IJ SOLN
INTRAMUSCULAR | Status: AC
  Filled 2019-01-12: qty 2

## 2019-01-12 MED ORDER — FENTANYL CITRATE (PF) 100 MCG/2ML IJ SOLN
INTRAMUSCULAR | Status: DC | PRN
  Administered 2019-01-12 (×2): 50 ug via INTRAVENOUS

## 2019-01-12 MED ORDER — MIDAZOLAM HCL 2 MG/2ML IJ SOLN
INTRAMUSCULAR | Status: AC
  Filled 2019-01-12: qty 2

## 2019-01-12 MED ORDER — PROPOFOL 500 MG/50ML IV EMUL
INTRAVENOUS | Status: AC
  Filled 2019-01-12: qty 50

## 2019-01-12 MED ORDER — EPHEDRINE SULFATE 50 MG/ML IJ SOLN
INTRAMUSCULAR | Status: DC | PRN
  Administered 2019-01-12: 10 mg via INTRAVENOUS

## 2019-01-12 MED ORDER — MIDAZOLAM HCL 5 MG/5ML IJ SOLN
INTRAMUSCULAR | Status: DC | PRN
  Administered 2019-01-12: 2 mg via INTRAVENOUS

## 2019-01-12 MED ORDER — PHENYLEPHRINE HCL 10 MG/ML IJ SOLN
INTRAMUSCULAR | Status: DC | PRN
  Administered 2019-01-12: 100 ug via INTRAVENOUS

## 2019-01-12 MED ORDER — PROPOFOL 10 MG/ML IV BOLUS
INTRAVENOUS | Status: DC | PRN
  Administered 2019-01-12: 100 mg via INTRAVENOUS

## 2019-01-12 MED ORDER — PROPOFOL 500 MG/50ML IV EMUL
INTRAVENOUS | Status: DC | PRN
  Administered 2019-01-12: 180 ug/kg/min via INTRAVENOUS

## 2019-01-12 NOTE — H&P (Signed)
Vonda Antigua, MD [address], [place], [place], [place], [address] [address], [place], [place], [place], [address] Phone: [phone]  Fax: [phone]  Primary Care Physician:  Carmon Ginsberg, [place]   Pre-Procedure History & Physical: HPI:  Derrick Romero is a 58 y.o. male is here for a colonoscopy.   Past Medical History:  Diagnosis Date  . Allergic rhinitis     History reviewed. No pertinent surgical history.  Prior to Admission medications   Medication Sig Start Date End Date Taking? Authorizing Provider  albuterol (PROVENTIL HFA;VENTOLIN HFA) 108 (90 Base) MCG/ACT inhaler Inhale 2 puffs into the lungs every 6 (six) hours as needed for wheezing or shortness of breath. 06/28/17  Yes Chauvin, Robert, PA  Fexofenadine-Pseudoephedrine (ALLEGRA-D 24 HOUR PO) Take by mouth.   Yes [provider]  fluticasone (FLONASE) 50 MCG/ACT nasal spray Place into both nostrils daily.   Yes [provider]  Misc Natural Products (GLUCOSAMINE CHONDROITIN ADV) TABS Take by mouth.   Yes [provider]  psyllium (METAMUCIL) 58.6 % packet Take 1 packet by mouth daily.   Yes [provider]  XIAFLEX 0.9 MG SOLR  11/29/18  Yes [provider]    Allergies as of 12/28/2018  . (No Known Allergies)    Family History  Problem Relation Age of Onset  . Asthma Father   . Melanoma Father   . Cancer Maternal Grandmother   . Heart disease Paternal Grandfather     Social History   Socioeconomic History  . Marital status: Married    Spouse name: Not on file  . Number of children: 2  . Years of education: 49  . Highest education level: Not on file  Occupational History  . Occupation: Facilities manager  Social Needs  . Financial resource strain: Not on file  . Food insecurity:    Worry: Not on file    Inability: Not on file  . Transportation needs:    Medical: Not on file    Non-medical: Not on file  Tobacco Use  . Smoking status:  Never Smoker  . Smokeless tobacco: Never Used  Substance and Sexual Activity  . Alcohol use: Yes    Comment: occasional  . Drug use: No  . Sexual activity: Yes    Partners: Female  Lifestyle  . Physical activity:    Days per week: Not on file    Minutes per session: Not on file  . Stress: Not on file  Relationships  . Social connections:    Talks on phone: Not on file    Gets together: Not on file    Attends religious service: Not on file    Active member of club or organization: Not on file    Attends meetings of clubs or organizations: Not on file    Relationship status: Not on file  . Intimate partner violence:    Fear of current or ex partner: Not on file    Emotionally abused: Not on file    Physically abused: Not on file    Forced sexual activity: Not on file  Other Topics Concern  . Not on file  Social History Narrative  . Not on file    Review of Systems: See HPI, otherwise negative ROS  Physical Exam: BP 118/74   Pulse 69   Temp (!) 96.8 F (36 C) (Tympanic)   Resp 18   Ht 5\' 10"  (1.778 m)   Wt 73 kg   SpO2 97%  BMI 23.10 kg/m  General:   Alert,  pleasant and cooperative in NAD Head:  Normocephalic and atraumatic. Neck:  Supple; no masses or thyromegaly. Lungs:  Clear throughout to auscultation, normal respiratory effort.    Heart:  +S1, +S2, Regular rate and rhythm, No edema. Abdomen:  Soft, nontender and nondistended. Normal bowel sounds, without guarding, and without rebound.   Neurologic:  Alert and  oriented x4;  grossly normal neurologically.  Impression/Plan: Derrick Romero is here for a colonoscopy to be performed for average risk screening.  Risks, benefits, limitations, and alternatives regarding  colonoscopy have been reviewed with the patient.  Questions have been answered.  All parties agreeable.   Virgel Manifold, MD  01/12/2019, 11:08 AM

## 2019-01-12 NOTE — Transfer of Care (Signed)
Immediate Anesthesia Transfer of Care Note  Patient: Derrick Romero  Procedure(s) Performed: COLONOSCOPY WITH PROPOFOL (N/A )  Patient Location: PACU and Endoscopy Unit  Anesthesia Type:General  Level of Consciousness: drowsy  Airway & Oxygen Therapy: Patient Spontanous Breathing and Patient connected to nasal cannula oxygen  Post-op Assessment: Report given to RN and Post -op Vital signs reviewed and stable  Post vital signs: Reviewed and stable  Last Vitals:  Vitals Value Taken Time  BP 96/57 01/12/2019 12:07 PM  Temp 36.1 C 01/12/2019 12:07 PM  Pulse 62 01/12/2019 12:08 PM  Resp 15 01/12/2019 12:08 PM  SpO2 99 % 01/12/2019 12:08 PM  Vitals shown include unvalidated device data.  Last Pain:  Vitals:   01/12/19 1207  TempSrc: Tympanic  PainSc: Asleep         Complications: No apparent anesthesia complications

## 2019-01-12 NOTE — Op Note (Signed)
[facility] Gastroenterology Patient Name: Derrick Romero Procedure Date: 01/12/2019 11:13 AM MRN: [id number] Account #: [id number] Date of Birth: July 28, 1961 Admit Type: Outpatient Age: 58 Room: [facility] ENDO ROOM 1 Gender: Male Note Status: Finalized Procedure:            Colonoscopy Indications:          Screening for colorectal malignant neoplasm Providers:            Sanyiah Kanzler B. Bonna Gains MD, MD Referring MD:         Rose Fillers. Jaynie Crumble, MD (Referring MD) Medicines:            Monitored Anesthesia Care Complications:        No immediate complications. Procedure:            Pre-Anesthesia Assessment:                       - ASA Grade Assessment: II - A patient with mild                        systemic disease.                       - Prior to the procedure, a History and Physical was                        performed, and patient medications, allergies and                        sensitivities were reviewed. The patient's tolerance of                        previous anesthesia was reviewed.                       - The risks and benefits of the procedure and the                        sedation options and risks were discussed with the                        patient. All questions were answered and informed                        consent was obtained.                       - Patient identification and proposed procedure were                        verified prior to the procedure by the physician, the                        nurse, the anesthesiologist, the anesthetist and the                        technician. The procedure was verified in the procedure                        room.  After obtaining informed consent, the colonoscope was                        passed under direct vision. Throughout the procedure,                        the patient's blood pressure, pulse, and oxygen                        saturations were monitored continuously. The                      Colonoscope was introduced through the anus and                        advanced to the the cecum, identified by appendiceal                        orifice and ileocecal valve. The colonoscopy was                        performed with ease. The patient tolerated the                        procedure well. The quality of the bowel preparation                        was good. Findings:      The perianal and digital rectal examinations were normal.      A 6 mm polyp was found in the cecum. The polyp was sessile. The polyp       was removed with a cold snare. Resection and retrieval were complete.      A 4 mm polyp was found in the descending colon. The polyp was sessile.       Polypectomy was attempted, initially using a cold snare. Polyp resection       was incomplete with this device. This intervention then required a       different device and polypectomy technique. The polyp was removed with a       cold biopsy forceps. Resection and retrieval were complete.      The exam was otherwise without abnormality.      The rectum, sigmoid colon, descending colon, transverse colon, ascending       colon and cecum appeared normal.      The retroflexed view of the distal rectum and anal verge was normal and       showed no anal or rectal abnormalities. Impression:           - One 6 mm polyp in the cecum, removed with a cold                        snare. Resected and retrieved.                       - One 4 mm polyp in the descending colon, removed with                        a cold biopsy forceps. Resected and retrieved.                       -  The examination was otherwise normal.                       - The rectum, sigmoid colon, descending colon,                        transverse colon, ascending colon and cecum are normal.                       - The distal rectum and anal verge are normal on                        retroflexion view. Recommendation:       - Discharge patient  to home (with escort).                       - Advance diet as tolerated.                       - Continue present medications.                       - Await pathology results.                       - Repeat colonoscopy in 5 years.                       - The findings and recommendations were discussed with                        the patient.                       - The findings and recommendations were discussed with                        the patient's family.                       - Return to primary care physician as previously                        scheduled. Procedure Code(s):    --- Professional ---                       [phone], Colonoscopy, flexible; with removal of tumor(s),                        polyp(s), or other lesion(s) by snare technique                       87564, 3, Colonoscopy, flexible; with biopsy, single                        or multiple Diagnosis Code(s):    --- Professional ---                       Z12.11, Encounter for screening for malignant neoplasm                        of colon  D12.0, Benign neoplasm of cecum                       D12.4, Benign neoplasm of descending colon CPT copyright 2018 American Medical Association. All rights reserved. The codes documented in this report are preliminary and upon coder review may  be revised to meet current compliance requirements.  Vonda Antigua, MD Margretta Sidle B. Bonna Gains MD, MD 01/12/2019 12:04:14 PM This report has been signed electronically. Number of Addenda: 0 Note Initiated On: 01/12/2019 11:13 AM Scope Withdrawal Time: 0 hours 19 minutes 34 seconds  Total Procedure Duration: 0 hours 29 minutes 17 seconds  Estimated Blood Loss: Estimated blood loss: none.      [facility]

## 2019-01-12 NOTE — Anesthesia Post-op Follow-up Note (Signed)
Anesthesia QCDR form completed.        

## 2019-01-13 LAB — SURGICAL PATHOLOGY

## 2019-01-14 NOTE — Anesthesia Postprocedure Evaluation (Signed)
Anesthesia Post Note  Patient: Derrick Romero  Procedure(s) Performed: COLONOSCOPY WITH PROPOFOL (N/A )  Patient location during evaluation: PACU Anesthesia Type: General Level of consciousness: awake and alert Pain management: pain level controlled Vital Signs Assessment: post-procedure vital signs reviewed and stable Respiratory status: spontaneous breathing, nonlabored ventilation and respiratory function stable Cardiovascular status: blood pressure returned to baseline and stable Postop Assessment: no apparent nausea or vomiting Anesthetic complications: no     Last Vitals:  Vitals:   01/12/19 1209 01/12/19 1237  BP:    Pulse: 63   Resp: 16 19  Temp:    SpO2: 99%     Last Pain:  Vitals:   01/13/19 0750  TempSrc:   PainSc: 0-No pain                 Durenda Hurt

## 2019-01-14 NOTE — Anesthesia Preprocedure Evaluation (Signed)
Anesthesia Evaluation  Patient identified by MRN, date of birth, ID band Patient awake    Reviewed: Allergy & Precautions, H&P , NPO status , Patient's Chart, lab work & pertinent test results  Airway Mallampati: III  TM Distance: >3 FB     Dental   Pulmonary neg pulmonary ROS,           Cardiovascular negative cardio ROS       Neuro/Psych negative neurological ROS  negative psych ROS   GI/Hepatic negative GI ROS, Neg liver ROS,   Endo/Other  negative endocrine ROS  Renal/GU negative Renal ROS  negative genitourinary   Musculoskeletal   Abdominal   Peds  Hematology negative hematology ROS (+)   Anesthesia Other Findings Past Medical History: No date: Allergic rhinitis  Past Surgical History: 01/12/2019: COLONOSCOPY WITH PROPOFOL; N/A     Comment:  Procedure: COLONOSCOPY WITH PROPOFOL;  Surgeon:               Virgel Manifold, MD;  Location: [facility] ENDOSCOPY;                Service: Endoscopy;  Laterality: N/A;  BMI    Body Mass Index:  23.10 kg/m      Reproductive/Obstetrics negative OB ROS                             Anesthesia Physical Anesthesia Plan  ASA: I  Anesthesia Plan: General   Post-op Pain Management:    Induction:   PONV Risk Score and Plan: Propofol infusion and TIVA  Airway Management Planned:   Additional Equipment:   Intra-op Plan:   Post-operative Plan:   Informed Consent: I have reviewed the patients History and Physical, chart, labs and discussed the procedure including the risks, benefits and alternatives for the proposed anesthesia with the patient or authorized representative who has indicated his/her understanding and acceptance.     Dental Advisory Given  Plan Discussed with: Anesthesiologist and CRNA  Anesthesia Plan Comments:         Anesthesia Quick Evaluation

## 2019-01-16 ENCOUNTER — Encounter: Payer: Self-pay | Admitting: Gastroenterology

## 2019-01-20 ENCOUNTER — Other Ambulatory Visit: Payer: Self-pay

## 2019-01-20 ENCOUNTER — Encounter: Payer: Self-pay | Admitting: *Deleted

## 2019-01-20 ENCOUNTER — Emergency Department
Admission: EM | Admit: 2019-01-20 | Discharge: 2019-01-21 | Disposition: A | Discharge status: Other Acute Inpatient Hospital | Payer: Managed Care, Other (non HMO) | Attending: Emergency Medicine | Admitting: Emergency Medicine

## 2019-01-20 DIAGNOSIS — F319 Bipolar disorder, unspecified: Secondary | ICD-10-CM | POA: Diagnosis not present

## 2019-01-20 DIAGNOSIS — Z79899 Other long term (current) drug therapy: Secondary | ICD-10-CM | POA: Diagnosis not present

## 2019-01-20 DIAGNOSIS — F10929 Alcohol use, unspecified with intoxication, unspecified: Secondary | ICD-10-CM

## 2019-01-20 DIAGNOSIS — F1994 Other psychoactive substance use, unspecified with psychoactive substance-induced mood disorder: Secondary | ICD-10-CM | POA: Insufficient documentation

## 2019-01-20 DIAGNOSIS — F10129 Alcohol abuse with intoxication, unspecified: Secondary | ICD-10-CM | POA: Diagnosis not present

## 2019-01-20 DIAGNOSIS — Z046 Encounter for general psychiatric examination, requested by authority: Secondary | ICD-10-CM | POA: Diagnosis present

## 2019-01-20 DIAGNOSIS — R45851 Suicidal ideations: Secondary | ICD-10-CM | POA: Diagnosis not present

## 2019-01-20 LAB — ETHANOL: Alcohol, Ethyl (B): 128 mg/dL — ABNORMAL HIGH (ref <10)

## 2019-01-20 LAB — CBC
HCT: 41.9 % (ref 39.0–52.0)
Hemoglobin: 13.8 g/dL (ref 13.0–17.0)
MCH: 30.4 pg (ref 26.0–34.0)
MCHC: 32.9 g/dL (ref 30.0–36.0)
MCV: 92.3 fL (ref 80.0–100.0)
Platelets: 342 10*3/uL (ref 150–400)
RBC: 4.54 MIL/uL (ref 4.22–5.81)
RDW: 12.8 % (ref 11.5–15.5)
WBC: 9.1 10*3/uL (ref 4.0–10.5)
nRBC: 0 % (ref 0.0–0.2)

## 2019-01-20 LAB — COMPREHENSIVE METABOLIC PANEL
ALK PHOS: 49 U/L (ref 38–126)
ALT: 33 U/L (ref 0–44)
ANION GAP: 13 (ref 5–15)
AST: 50 U/L — ABNORMAL HIGH (ref 15–41)
Albumin: 4.3 g/dL (ref 3.5–5.0)
BUN: 17 mg/dL (ref 6–20)
CALCIUM: 8.9 mg/dL (ref 8.9–10.3)
CO2: 20 mmol/L — ABNORMAL LOW (ref 22–32)
Chloride: 104 mmol/L (ref 98–111)
Creatinine, Ser: 0.94 mg/dL (ref 0.61–1.24)
GFR calc Af Amer: >60 mL/min (ref >60)
GFR calc non Af Amer: >60 mL/min (ref >60)
Glucose, Bld: 108 mg/dL — ABNORMAL HIGH (ref 70–99)
Potassium: 3.7 mmol/L (ref 3.5–5.1)
Sodium: 137 mmol/L (ref 135–145)
Total Bilirubin: 0.3 mg/dL (ref 0.3–1.2)
Total Protein: 7.6 g/dL (ref 6.5–8.1)

## 2019-01-20 LAB — SALICYLATE LEVEL: Salicylate Lvl: <7 mg/dL (ref 2.8–30.0)

## 2019-01-20 LAB — ACETAMINOPHEN LEVEL: Acetaminophen (Tylenol), Serum: <10 ug/mL — ABNORMAL LOW (ref 10–30)
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
## 2019-01-21 ENCOUNTER — Inpatient Hospital Stay
Admission: AD | Admit: 2019-01-21 | Discharge: 2019-01-25 | DRG: 885 | Disposition: A | Discharge status: Home / Self Care | Payer: 59 | Source: Intra-hospital | Attending: Psychiatry | Admitting: Psychiatry

## 2019-01-21 ENCOUNTER — Encounter: Payer: Self-pay | Admitting: Psychiatry

## 2019-01-21 DIAGNOSIS — J45909 Unspecified asthma, uncomplicated: Secondary | ICD-10-CM | POA: Diagnosis present

## 2019-01-21 DIAGNOSIS — Z560 Unemployment, unspecified: Secondary | ICD-10-CM | POA: Diagnosis not present

## 2019-01-21 DIAGNOSIS — R45851 Suicidal ideations: Secondary | ICD-10-CM | POA: Diagnosis present

## 2019-01-21 DIAGNOSIS — Z7951 Long term (current) use of inhaled steroids: Secondary | ICD-10-CM | POA: Diagnosis not present

## 2019-01-21 DIAGNOSIS — Z79899 Other long term (current) drug therapy: Secondary | ICD-10-CM | POA: Diagnosis not present

## 2019-01-21 DIAGNOSIS — F3160 Bipolar disorder, current episode mixed, unspecified: Secondary | ICD-10-CM | POA: Diagnosis not present

## 2019-01-21 DIAGNOSIS — F319 Bipolar disorder, unspecified: Secondary | ICD-10-CM | POA: Diagnosis present

## 2019-01-21 DIAGNOSIS — Z5329 Procedure and treatment not carried out because of patient's decision for other reasons: Secondary | ICD-10-CM | POA: Diagnosis present

## 2019-01-21 DIAGNOSIS — M720 Palmar fascial fibromatosis [Dupuytren]: Secondary | ICD-10-CM | POA: Diagnosis present

## 2019-01-21 DIAGNOSIS — F3110 Bipolar disorder, current episode manic without psychotic features, unspecified: Principal | ICD-10-CM | POA: Diagnosis present

## 2019-01-21 LAB — URINE DRUG SCREEN, QUALITATIVE (ARMC ONLY)
Amphetamines, Ur Screen: NOT DETECTED
Barbiturates, Ur Screen: NOT DETECTED
Benzodiazepine, Ur Scrn: NOT DETECTED
Cannabinoid 50 Ng, Ur ~~LOC~~: NOT DETECTED
Cocaine Metabolite,Ur ~~LOC~~: NOT DETECTED
MDMA (Ecstasy)Ur Screen: NOT DETECTED
Methadone Scn, Ur: NOT DETECTED
OPIATE, UR SCREEN: NOT DETECTED
PHENCYCLIDINE (PCP) UR S: NOT DETECTED
Tricyclic, Ur Screen: NOT DETECTED

## 2019-01-21 MED ORDER — ADULT MULTIVITAMIN W/MINERALS CH
1.0000 | ORAL_TABLET | Freq: Every day | ORAL | Status: DC
  Administered 2019-01-22 – 2019-01-25 (×4): 1 via ORAL
  Filled 2019-01-21 (×4): qty 1

## 2019-01-21 MED ORDER — THIAMINE HCL 100 MG/ML IJ SOLN: 100.0000 mg | Freq: Once | INTRAMUSCULAR | Status: DC

## 2019-01-21 MED ORDER — LORAZEPAM 1 MG PO TABS: 1.0000 mg | ORAL_TABLET | Freq: Four times a day (QID) | ORAL | Status: AC | PRN

## 2019-01-21 MED ORDER — ALBUTEROL SULFATE HFA 108 (90 BASE) MCG/ACT IN AERS
2.0000 | INHALATION_SPRAY | Freq: Four times a day (QID) | RESPIRATORY_TRACT | Status: DC | PRN
  Filled 2019-01-21: qty 6.7

## 2019-01-21 MED ORDER — HYDROXYZINE HCL 25 MG PO TABS
25.0000 mg | ORAL_TABLET | Freq: Four times a day (QID) | ORAL | Status: AC | PRN
  Administered 2019-01-22: 25 mg via ORAL
  Filled 2019-01-21: qty 1

## 2019-01-21 MED ORDER — ONDANSETRON 4 MG PO TBDP: 4.0000 mg | ORAL_TABLET | Freq: Four times a day (QID) | ORAL | Status: AC | PRN

## 2019-01-21 MED ORDER — FLUTICASONE PROPIONATE 50 MCG/ACT NA SUSP
2.0000 | Freq: Every day | NASAL | Status: DC
  Administered 2019-01-21 – 2019-01-25 (×5): 2 via NASAL
  Filled 2019-01-21: qty 16

## 2019-01-21 MED ORDER — LOPERAMIDE HCL 2 MG PO CAPS: 2.0000 mg | ORAL_CAPSULE | ORAL | Status: AC | PRN

## 2019-01-21 MED ORDER — MAGNESIUM HYDROXIDE 400 MG/5ML PO SUSP: 30.0000 mL | Freq: Every day | ORAL | Status: DC | PRN

## 2019-01-21 MED ORDER — ALUM & MAG HYDROXIDE-SIMETH 200-200-20 MG/5ML PO SUSP: 30.0000 mL | ORAL | Status: DC | PRN

## 2019-01-21 MED ORDER — ACETAMINOPHEN 325 MG PO TABS: 650.0000 mg | ORAL_TABLET | Freq: Four times a day (QID) | ORAL | Status: DC | PRN

## 2019-01-21 MED ORDER — VITAMIN B-1 100 MG PO TABS
100.0000 mg | ORAL_TABLET | Freq: Every day | ORAL | Status: DC
  Administered 2019-01-22 – 2019-01-25 (×4): 100 mg via ORAL
  Filled 2019-01-21 (×4): qty 1

## 2019-01-21 NOTE — ED Notes (Signed)
Pt given lunch tray. Hand hygiene encouraged.

## 2019-01-21 NOTE — BH Assessment (Signed)
Patient is to be admitted to [facility] BMU by Dr. Solon Palm.  Attending Physician will be Dr. Weber Cooks.   Patient has been assigned to room 319, by [place] Nurse [name].   Intake Paper Work has been signed and placed on patient chart.  ER staff is aware of the admission:  Yong Channel, ER Nurse will inform ER Secretary    Dr. Burlene Arnt, ER MD   Amy B.  Patient's Nurse   Oley Balm, Patient Access.

## 2019-01-21 NOTE — ED Notes (Signed)
Pt discharged under IVC to BMU. Report called to Coralyn Mark, Therapist, sports.  VS stable. Belongings sent with patient.

## 2019-01-21 NOTE — ED Notes (Signed)
Report given to Terry, RN
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
## 2019-01-21 NOTE — ED Notes (Signed)
Pt light turned off & a warm blanket offered, pt declined
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
## 2019-01-21 NOTE — ED Notes (Signed)
Pt. Introduced to unit.  Pt. Advised of cameras and safety checks.  Pt. Requested and was given cup of water.  Pt. Advised to come to this nurse for any concerns or questions.  Pt. Calm and cooperative at this time.

## 2019-01-21 NOTE — Tx Team (Addendum)
Initial Treatment Plan 01/21/2019 4:09 PM Derrick Romero [id number]    PATIENT STRESSORS: Marital or family conflict Substance abuse Other: Mood instability    PATIENT STRENGTHS: Ability for insight Active sense of humor Average or above average intelligence Capable of independent living Communication skills Financial means General fund of knowledge Physical Health Special hobby/interest Supportive family/friends Work skills   PATIENT IDENTIFIED PROBLEMS: Suicidal ideations 01/21/2019  Mood instability 01/21/2019  Substance Abuse 01/21/2019                 DISCHARGE CRITERIA:  Improved stabilization in mood, thinking, and/or behavior Motivation to continue treatment in a less acute level of care Need for constant or close observation no longer present Verbal commitment to aftercare and medication compliance  PRELIMINARY DISCHARGE PLAN: Outpatient therapy Participate in family therapy Return to previous living arrangement Return to previous work or school arrangements  PATIENT/FAMILY INVOLVEMENT: This treatment plan has been presented to and reviewed with the patient, Derrick Romero.The patient has been given the opportunity to ask questions and make suggestions.  Reyes Ivan, RN 01/21/2019, 4:09 PM

## 2019-01-21 NOTE — ED Notes (Signed)
Pt given supplies to take a shower. Maintained on 15 minute checks and observation by security camera for safety.
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
## 2019-01-21 NOTE — ED Notes (Signed)
Pt given breakfast tray. Hand hygiene encouraged.  

## 2019-01-21 NOTE — ED Notes (Signed)
SOC in progress.  

## 2019-01-21 NOTE — ED Notes (Signed)
Psychiatrist at bedside. Pt calm at this time. Maintained on 15 minute checks and observation by security camera for safety.

## 2019-01-21 NOTE — Progress Notes (Deleted)
MD at bedside. 

## 2019-01-21 NOTE — Progress Notes (Signed)
EKG was completed with results hand delivered to Dr. Tamala Julian, DO. Copy also placed on the chart for additional reviewing.

## 2019-01-21 NOTE — Plan of Care (Signed)
Patient just recently admitted to the unit. Patient has not had sufficient time to show progressions at this time. Will continue to monitor for progressions.   Problem: Education: Goal: Emotional status will improve Outcome: Not Progressing Goal: Mental status will improve Outcome: Not Progressing   Problem: Health Behavior/Discharge Planning: Goal: Compliance with treatment plan for underlying cause of condition will improve Outcome: Not Progressing   Problem: Safety: Goal: Periods of time without injury will increase Outcome: Not Progressing   Problem: Health Behavior/Discharge Planning: Goal: Ability to identify changes in lifestyle to reduce recurrence of condition will improve Outcome: Not Progressing   Problem: Physical Regulation: Goal: Complications related to the disease process, condition or treatment will be avoided or minimized Outcome: Not Progressing   Problem: Safety: Goal: Ability to remain free from injury will improve Outcome: Not Progressing   Problem: Self-Concept: Goal: Ability to disclose and discuss suicidal ideas will improve Outcome: Not Progressing

## 2019-01-21 NOTE — Progress Notes (Signed)
Patient is appropriate and engaging upon approach, patient is in the milieu socializing and watching television with peers, patient admitted felling overwhelmed and anxious , encouraged patient to develop a coping skill and to manage stress to improve self compassion, patient mood said feels,  worthless, low self esteem  And  Affect is appropriate to circumstance,patient is compliant with his medications, denies any suicidal , homicidal ideations, denies hallucinations and delusions and denies any physical complains, appetite is good and well hydrated with fluids and juices, sleep is good requiring only 15 minutes rounding for safety, support and encouragement is provided no  Distress noted.

## 2019-01-22 DIAGNOSIS — F3110 Bipolar disorder, current episode manic without psychotic features, unspecified: Secondary | ICD-10-CM | POA: Diagnosis present

## 2019-01-22 LAB — HEMOGLOBIN A1C
Hgb A1c MFr Bld: 5.9 % — ABNORMAL HIGH (ref 4.8–5.6)
MEAN PLASMA GLUCOSE: 122.63 mg/dL

## 2019-01-22 LAB — TSH: TSH: 1.749 u[IU]/mL (ref 0.350–4.500)

## 2019-01-22 NOTE — Progress Notes (Signed)
Patient is stable and responding appropriately in the unit and maintaining safety of self and others, contract for safety verbally. Medication is provided and no side effects from medications. Patient is socializing with peers with out any out bust or violent behaviors, appetite is good, well hydrated , patient needs no help with ADLs , denies any SI/HI/AVH, patient sleep through the night without interruptions , 15 minutes safety checks in progress. No distress noted.

## 2019-01-22 NOTE — BHH Group Notes (Signed)
LCSW Group Therapy Note 01/22/2019 1:15pm  Type of Therapy and Topic: Group Therapy: Feelings Around Returning Home & Establishing a Supportive Framework and Supporting Oneself When Supports Not Available  Participation Level: Active  Description of Group:  Patients first processed thoughts and feelings about upcoming discharge. These included fears of upcoming changes, lack of change, new living environments, judgements and expectations from others and overall stigma of mental health issues. The group then discussed the definition of a supportive framework, what that looks and feels like, and how do to discern it from an unhealthy non-supportive network. The group identified different types of supports as well as what to do when your family/friends are less than helpful or unavailable  Therapeutic Goals  1. Patient will identify one healthy supportive network that they can use at discharge. 2. Patient will identify one factor of a supportive framework and how to tell it from an unhealthy network. 3. Patient able to identify one coping skill to use when they do not have positive supports from others. 4. Patient will demonstrate ability to communicate their needs through discussion and/or role plays.  Summary of Patient Progress:  The patient reported he feels "pretty decent today." Pt engaged during group session. As patients processed their anxiety about discharge and described healthy supports patient shared he is readyto be discharge. He stated, "I'm good to work on a plan to be discharge." He listed his friends as his main support.  Patients identified at least one self-care tool they were willing to use after discharge; listening to music.   Therapeutic Modalities Cognitive Behavioral Therapy Motivational Interviewing   Aaliyana Fredericks  CUEBAS-COLON, LCSW 01/22/2019 8:16 AM
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
## 2019-01-22 NOTE — BHH Suicide Risk Assessment (Signed)
[facility] INPATIENT:  Family/Significant Other Suicide Prevention Education  Suicide Prevention Education:  Patient Refusal for Family/Significant Other Suicide Prevention Education: The patient Derrick Romero has refused to provide written consent for family/significant other to be provided Family/Significant Other Suicide Prevention Education during admission and/or prior to discharge.  Physician notified.  Bernabe Dorce  CUEBAS-COLON 01/22/2019, 11:13 AM
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
## 2019-01-22 NOTE — H&P (Signed)
Psychiatric Admission Assessment Adult  Patient Identification: Derrick Romero MRN:  [id number] Date of Evaluation:  01/22/2019 Chief Complaint:  Suicidal ideations Unspecified bipolar disorder Principal Diagnosis: <principal problem not specified> Diagnosis:  Active Problems:   Bipolar disorder, unspecified ([place])  History of Present Illness:  Derrick Romero is a 58yo M with a past psych h/o bipolar disorder, who was admitted to inpatient psych unit after he was brought to the ER by police in settings of alcohol intoxication, altercation with wife and making suicidal statements.  His BAL was 128 mg/dl. During ER psych consult, patient eventually admitted that he had voiced suicidal thoughts with a plan to shoot himself in the head with a firearm; he was notably garrulous and circumferential, it is unclear if this is secondary to his defensive operations or a manifestation of loose associations/flight of ideas secondary to a bipolar disorder. Patient was admitted to inpatient psych unit for stabilization.  His first night in the unit was uneventful. He appears restless and hypertalkative. His behavior overall is appropriate.  On interview today, patient reports that he had 4-5 beers yesterday and they started arguing with his wife about current home issues, he got agitated and accidentally broke a glass in the [place] door. Reportedly, when his wife said she calls the police, patient made verbal suicidal threat and went to his bedroom to sleep. The police arrived and took the patient into the ER.  He reports feeling "fine" at the moment. He denies feeling depressed or anxious. He reports he lost his job due to health issues about a year ago, but states it does not make him feel depressed. He denies suicidal or homicidal thoughts, plans, intents. Denies any symptoms of mania, denies any hallucinations, does not express delusions. He denies any past psych history and past psych medication trials. He  believes he does not belong here, that he does not need any psych medications and needs to be discharged. He understands that he might require an observation in the hospital for some time. He gave me permission to call his wife for collateral information. He asks his wife to bring a hand device for hand contracture.  Spoke with patient's wife for collateral information: patient had episodes of mood swings for last 20 years, he was diagnosed with bipolar disorder about 7-8 years ago. He never had a trial of psych medication due to his refusal. No history of inpatient psych admissions. No history of past sucidal attempts or violence. His mood worsened for last several months - mood swings, swearing and angry, shopping spree, cleaning house and moving furniture, sleeps 2-3 hours per night, constantly on edge, restless. No suicidal or homicidal statements prior to the last one. She believes patient requires treatment and not safe to be discharged without treatment.  Discussed with the patient my suspicion for manic behavior in settings of new informatiom obtained from his wife. Patient continues to minimize symptoms and trying to intellectualize them. He refuses any psychotropic medications.  PAST PSYCH HISTORY:  Previous psych diagnoses: bipolar disorder Previous psychiatric hospitalizations: denies Previous outpatient psychiatrist: none Therapist/Counselor: none History of prior suicide attempts: denies Non-suicidal self-injurious behaviors: denies History of violence: denies Previous psych medication: denies Current psych medications: none    MEDICAL HISTORY: Dupuytren contracture   ALLERGIES: Patient reports NKDA    [place]: no results found.   SOCIAL HISTORY: -Patient has no guardian. -Adverse childhood experience: denies h/o physical, emotional, sexual abuse.  -Currently lives: with family in [place],  [place] -Marital/relationships history: married for 29  years -Children: 1yo daughter and 21yo son. 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 -Military history: denies -Guns in possession: rifle was taken by police (confirmed with patient's wife).    SUBSTANCE USE: Alcohol: 4-5 beers 2-3 times week Nicotine:  denies Illicit drug use: denies.  FAMILY HISTORY:   Patient denies a family history significant for mental illness, addiction, alcoholism, and/or suicide in family members.    Associated Signs/Symptoms: Depression Symptoms:  denies (Hypo) Manic Symptoms:  Distractibility, Elevated Mood, Flight of Ideas, Impulsivity, Irritable Mood, Anxiety Symptoms:  denoes Psychotic Symptoms:  denies PTSD Symptoms: Negative Total Time spent with patient: 1 hour  Past Psychiatric History: see above  Is the patient at risk to self? No.  Has the patient been a risk to self in the past 6 months? No.  Has the patient been a risk to self within the distant past? No.  Is the patient a risk to others? No.  Has the patient been a risk to others in the past 6 months? No.  Has the patient been a risk to others within the distant past? No.   Prior Inpatient Therapy:   Prior Outpatient Therapy:    Alcohol Screening: 1. How often do you have a drink containing alcohol?: 2 to 4 times a month 2. How many drinks containing alcohol do you have on a typical day when you are drinking?: 3 or 4 3. How often do you have six or more drinks on one occasion?: Less than monthly AUDIT-C Score: 4 4. How often during the last year have you found that you were not able to stop drinking once you had started?: Never 5. How often during the last year have you failed to do what was normally expected from you becasue of drinking?: Never 6. How often during the last year have you needed a first drink in the morning to get yourself going after a heavy drinking session?:  Never 7. How often during the last year have you had a feeling of guilt of remorse after drinking?: Never 8. How often during the last year have you been unable to remember what happened the night before because you had been drinking?: Never 9. Have you or someone else been injured as a result of your drinking?: No 10. Has a relative or friend or a doctor or another health worker been concerned about your drinking or suggested you cut down?: No Alcohol Use Disorder Identification Test Final Score (AUDIT): 4 Alcohol Brief Interventions/Follow-up: AUDIT Score <7 follow-up not indicated Substance Abuse History in the last 12 months:  Yes.   Consequences of Substance Abuse: Negative Previous Psychotropic Medications: No  Psychological Evaluations: No  Past Medical History:  Past Medical History:  Diagnosis Date  . Allergic rhinitis     Past Surgical History:  Procedure Laterality Date  . COLONOSCOPY WITH PROPOFOL N/A 01/12/2019   Procedure: COLONOSCOPY WITH PROPOFOL;  Surgeon: Virgel Manifold, MD;  Location: [facility] ENDOSCOPY;  Service: Endoscopy;  Laterality: N/A;   Family History:  Family History  Problem Relation Age of Onset  . Asthma Father   . Melanoma Father   . Cancer Maternal Grandmother   . Heart disease Paternal Grandfather    Family Psychiatric  History: see above Tobacco Screening: Have you used any form of tobacco in the last 30 days? (Cigarettes, Smokeless Tobacco, Cigars, and/or Pipes): No Social History:  Social History   Substance and Sexual Activity  Alcohol Use Yes   Comment: occasional     Social History   Substance and Sexual Activity  Drug Use No    Additional Social History: Marital status: Married Number of Years Married: 29 What types of issues is patient dealing with in the relationship?: lack of communication  Are you sexually active?: No What is your sexual orientation?: heterosexual Has your sexual activity been affected by drugs, alcohol,  medication, or emotional stress?: no Does patient have children?: Yes How many children?: 2 How is patient's relationship with their children?: pt reports he has 2 children (22yo daughter and 21yo son) they have a good relationship                         Allergies:   Allergies  Allergen Reactions  . Oak Bark  [Quercus Robur] Other (See Comments) and Shortness Of Breath   Lab Results:  Results for orders placed or performed during the hospital encounter of 01/21/19 (from the past 48 hour(s))  Hemoglobin A1c     Status: Abnormal   Collection Time: 01/20/19 10:37 PM  Result Value Ref Range   Hgb A1c MFr Bld 5.9 (H) 4.8 - 5.6 %    Comment: (NOTE) Pre diabetes:          5.7%-6.4% Diabetes:              >6.4% Glycemic control for   <7.0% adults with diabetes    Mean Plasma Glucose 122.63 mg/dL    Comment: Performed at [facility], [place] [address], [place], [place] [address]  TSH     Status: None   Collection Time: 01/20/19 10:37 PM  Result Value Ref Range   TSH 1.749 0.350 - 4.500 uIU/mL    Comment: Performed by a 3rd Generation assay with a functional sensitivity of <=0.01 uIU/mL. Performed at [facility], [address] [place], [place] [address]   Urine Drug Screen, Qualitative [facility] only)     Status: None   Collection Time: 01/21/19  2:36 PM  Result Value Ref Range   Tricyclic, Ur Screen NONE DETECTED NONE DETECTED   Amphetamines, Ur Screen NONE DETECTED NONE DETECTED   MDMA (Ecstasy)Ur Screen NONE DETECTED NONE DETECTED   Cocaine Metabolite,Ur [place] NONE DETECTED NONE DETECTED   Opiate, Ur Screen NONE DETECTED NONE DETECTED   Phencyclidine (PCP) Ur S NONE DETECTED NONE DETECTED   Cannabinoid 50 Ng, Ur [place] NONE DETECTED NONE DETECTED   Barbiturates, Ur Screen NONE DETECTED NONE DETECTED   Benzodiazepine, Ur Scrn NONE DETECTED NONE DETECTED   Methadone Scn, Ur NONE DETECTED NONE DETECTED    Comment: (NOTE) Tricyclics + metabolites, urine    Cutoff  1000 ng/mL Amphetamines + metabolites, urine  Cutoff 1000 ng/mL MDMA (Ecstasy), urine              Cutoff 500 ng/mL Cocaine Metabolite, urine          Cutoff 300 ng/mL Opiate + metabolites, urine        Cutoff 300 ng/mL Phencyclidine (PCP), urine         Cutoff 25 ng/mL Cannabinoid, urine                 Cutoff 50 ng/mL Barbiturates + metabolites, urine  Cutoff 200 ng/mL Benzodiazepine, urine              Cutoff 200 ng/mL Methadone, urine  Cutoff 300 ng/mL The urine drug screen provides only a preliminary, unconfirmed analytical test result and should not be used for non-medical purposes. Clinical consideration and professional judgment should be applied to any positive drug screen result due to possible interfering substances. A more specific alternate chemical method must be used in order to obtain a confirmed analytical result. Gas chromatography / mass spectrometry (GC/MS) is the preferred confirmat ory method. Performed at [facility], [address] [place], [place] [address]     Blood Alcohol level:  Lab Results  Component Value Date   ETH 128 (H) 09/32/6712    Metabolic Disorder Labs:  Lab Results  Component Value Date   HGBA1C 5.9 (H) 01/20/2019   MPG 122.63 01/20/2019   No results found for: PROLACTIN Lab Results  Component Value Date   CHOL 219 (H) 12/27/2018   TRIG 87 12/27/2018   HDL 59 12/27/2018   CHOLHDL 3.7 12/27/2018   LDLCALC 143 (H) 12/27/2018    Current Medications: Current Facility-Administered Medications  Medication Dose Route Frequency Provider Last Rate Last Dose  . acetaminophen (TYLENOL) tablet 650 mg  650 mg Oral Q6H PRN Solon Palm R, DO      . albuterol (PROVENTIL HFA;VENTOLIN HFA) 108 (90 Base) MCG/ACT inhaler 2 puff  2 puff Inhalation Q6H PRN Chong Sicilian, DO      . alum & mag hydroxide-simeth (MAALOX/MYLANTA) 200-200-20 MG/5ML suspension 30 mL  30 mL Oral Q4H PRN Chong Sicilian, DO      .  fluticasone (FLONASE) 50 MCG/ACT nasal spray 2 spray  2 spray Each Nare Daily Solon Palm R, DO   2 spray at 01/22/19 0844  . hydrOXYzine (ATARAX/VISTARIL) tablet 25 mg  25 mg Oral Q6H PRN Solon Palm R, DO      . loperamide (IMODIUM) capsule 2-4 mg  2-4 mg Oral PRN Chong Sicilian, DO      . LORazepam (ATIVAN) tablet 1 mg  1 mg Oral Q6H PRN Solon Palm R, DO      . magnesium hydroxide (MILK OF MAGNESIA) suspension 30 mL  30 mL Oral Daily PRN Chong Sicilian, DO      . multivitamin with minerals tablet 1 tablet  1 tablet Oral Daily Solon Palm R, DO   1 tablet at 01/22/19 0844  . ondansetron (ZOFRAN-ODT) disintegrating tablet 4 mg  4 mg Oral Q6H PRN Solon Palm R, DO      . thiamine (VITAMIN B-1) tablet 100 mg  100 mg Oral Daily Solon Palm R, DO   100 mg at 01/22/19 4580   PTA Medications: Medications Prior to Admission  Medication Sig Dispense Refill Last Dose  . albuterol (PROVENTIL HFA;VENTOLIN HFA) 108 (90 Base) MCG/ACT inhaler Inhale 2 puffs into the lungs every 6 (six) hours as needed for wheezing or shortness of breath. 1 Inhaler 2 Past Month at Unknown time  . Fexofenadine-Pseudoephedrine (ALLEGRA-D 24 HOUR PO) Take by mouth.   Past Week at Unknown time  . XIAFLEX 0.9 MG SOLR    Past Month at Unknown time  . fluticasone (FLONASE) 50 MCG/ACT nasal spray Place into both nostrils daily.   Not Taking at Unknown time  . Misc Natural Products (GLUCOSAMINE CHONDROITIN ADV) TABS Take by mouth.   Not Taking at Unknown time  . psyllium (METAMUCIL) 58.6 % packet Take 1 packet by mouth daily.   Not Taking at Unknown time    Musculoskeletal: Strength & Muscle Tone: within normal limits Gait & Station: normal  Patient leans: N/A  Psychiatric Specialty Exam: Physical Exam  Constitutional: He is oriented to person, place, and time. He appears well-developed.  HENT:  Head: Normocephalic and atraumatic.  Eyes: EOM are normal.  Neck: Normal range of motion. Neck supple.   Cardiovascular: Normal rate and regular rhythm.  Respiratory: Effort normal and breath sounds normal. No respiratory distress.  GI: Soft. He exhibits no distension.  Neurological: He is alert and oriented to person, place, and time. No cranial nerve deficit.  Skin: Skin is warm and dry.       Review of Systems  Constitutional: Negative.  Negative for fever.  HENT: Negative.   Eyes: Negative.   Respiratory: Negative.   Cardiovascular: Negative.   Gastrointestinal: Negative.   Genitourinary: Negative.   Musculoskeletal: Negative for joint pain.  Skin: Negative.   Neurological: Negative.   Endo/Heme/Allergies: Negative.     Blood pressure 138/77, pulse 74, temperature 98.3 F (36.8 C), temperature source Oral, resp. rate 18, height 5\' 10"  (1.778 m), weight 71.7 kg, SpO2 99 %.Body mass index is 22.67 kg/m.  General Appearance: Casual and Well Groomed  Eye Contact:  Good  Speech:  Pressured  Volume:  Normal  Mood:  Euthymic  Affect:  Appropriate  Thought Process:  Coherent, Goal Directed and Descriptions of Associations: Circumstantial  Orientation:  Full (Time, Place, and Person)  Thought Content:  Tangential  Suicidal Thoughts:  No  Homicidal Thoughts:  No  Memory:  Immediate;   Fair Recent;   Fair Remote;   Fair  Judgement:  Poor  Insight:  Lacking  Psychomotor Activity:  Normal  Concentration:  Concentration: Poor and Attention Span: Fair  Recall:  [employer] of Knowledge:  Fair  Language:  Fair  Akathisia:  No  Handed:  Right  AIMS (if indicated):     Assets:  Housing  ADL's:  Intact  Cognition:  WNL  Sleep:  Number of Hours: 6.45    Treatment Plan Summary:   Observation Level/Precautions:  15 minute checks  Laboratory:  None  Psychotherapy:    Medications:    Consultations:    Discharge Concerns:    Estimated LOS:  Other:     Physician Treatment Plan for Primary Diagnosis: <principal problem not specified> Long Term Goal(s): Improvement in  symptoms so as ready for discharge  Short Term Goals: Ability to identify changes in lifestyle to reduce recurrence of condition will improve, Ability to demonstrate self-control will improve, Ability to maintain clinical measurements within normal limits will improve, Compliance with prescribed medications will improve and Ability to identify triggers associated with substance abuse/mental health issues will improve  Physician Treatment Plan for Secondary Diagnosis: Active Problems:   Bipolar disorder, unspecified ([place])  Long Term Goal(s): Improvement in symptoms so as ready for discharge  Short Term Goals: Ability to demonstrate self-control will improve, Ability to identify and develop effective coping behaviors will improve, Ability to maintain clinical measurements within normal limits will improve, Compliance with prescribed medications will improve and Ability to identify triggers associated with substance abuse/mental health issues will improve     ASSESSMENT: Mr. Grell is a 58yo M with a past psych h/o bipolar disorder, who was admitted to inpatient psych unit after he was brought to the ER by police in settings of alcohol intoxication, altercation with wife and making suicidal statements. Patient minimizes his psych symptoms, objectively he shows tangential thought process, pressured speech and decreased need for sleep. Per collateral information from patient's wife, patient showed manic behavior  for last several weeks, with severe worsening three days ago. Patient has no insight in his mental condition and his judgement is poor. He refuses any offered psychotropic medications and might require court involvement next week.  Impression:  Bipolar disorder, current episode manic. Alcohol use disorder, moderate.   Plan: -continue involuntary inpatient psychiatric admission; 15-min safety checks; daily contact with patient to assess and evaluate symptoms and progress.  -continue  lorazepam taper and CIWA  -continue offering mood stabilizers.  -continue Thiamin 100mg  PO daily and Multivitamin 1tab PO daily for alcohol use disorder;  -continue Hydroxyzine 25mg  PO TID PRN anxiety;  -SW consult;  -Dispo: to be determined.       I certify that inpatient services furnished can reasonably be expected to improve the patient's condition.      Larita Fife, MD 3/22/20202:53 PM

## 2019-01-22 NOTE — Plan of Care (Signed)
D- Patient alert and oriented. Patient presents in a pleasant mood on assessment stating that he slept moderately well last night and had no major complaints to voice to this Probation officer. Patient denies SI, HI, AVH, and pain at this time. Patient also denies any signs/symptoms of depression/anxiety at this time stating "not at all, just missing my music". Patient's goal for today is "go forward plan", in which he will "reflect, contemplate, and develop go forward plan".  A- Scheduled medications administered to patient, per MD orders. Support and encouragement provided.  Routine safety checks conducted every 15 minutes.  Patient informed to notify staff with problems or concerns.  R- No adverse drug reactions noted. Patient contracts for safety at this time. Patient compliant with medications and treatment plan. Patient receptive, calm, and cooperative. Patient interacts well with others on the unit.  Patient remains safe at this time.   Problem: Education: Goal: Emotional status will improve Outcome: Progressing Goal: Mental status will improve Outcome: Progressing   Problem: Health Behavior/Discharge Planning: Goal: Compliance with treatment plan for underlying cause of condition will improve Outcome: Progressing   Problem: Safety: Goal: Periods of time without injury will increase Outcome: Progressing   Problem: Health Behavior/Discharge Planning: Goal: Ability to identify changes in lifestyle to reduce recurrence of condition will improve Outcome: Progressing   Problem: Physical Regulation: Goal: Complications related to the disease process, condition or treatment will be avoided or minimized Outcome: Progressing   Problem: Safety: Goal: Ability to remain free from injury will improve Outcome: Progressing   Problem: Self-Concept: Goal: Ability to disclose and discuss suicidal ideas will improve Outcome: Progressing

## 2019-01-23 DIAGNOSIS — F3110 Bipolar disorder, current episode manic without psychotic features, unspecified: Principal | ICD-10-CM

## 2019-01-23 MED ORDER — DIVALPROEX SODIUM 500 MG PO DR TAB
500.0000 mg | DELAYED_RELEASE_TABLET | Freq: Two times a day (BID) | ORAL | Status: DC
  Filled 2019-01-23: qty 1

## 2019-01-23 NOTE — Plan of Care (Signed)
D- Patient alert and oriented. Patient presents in a pleasant mood on assessment stating that he slept "pretty decent" last night and had no major complaints to voice to this Probation officer. Patient denies any signs/symptoms of depression/anxiety. Patient also denies SI, HI, AVH, and pain at this time. Patient's goal for today is "complete my discharge plan after meeting with doctor".  A- Scheduled medications administered to patient, per MD orders. Support and encouragement provided.  Routine safety checks conducted every 15 minutes.  Patient informed to notify staff with problems or concerns.  R- No adverse drug reactions noted. Patient contracts for safety at this time. Patient compliant with medications and treatment plan. Patient receptive, calm, and cooperative. Patient interacts well with others on the unit.  Patient remains safe at this time.   Problem: Education: Goal: Emotional status will improve Outcome: Progressing Goal: Mental status will improve Outcome: Progressing   Problem: Health Behavior/Discharge Planning: Goal: Compliance with treatment plan for underlying cause of condition will improve Outcome: Progressing   Problem: Safety: Goal: Periods of time without injury will increase Outcome: Progressing   Problem: Health Behavior/Discharge Planning: Goal: Ability to identify changes in lifestyle to reduce recurrence of condition will improve Outcome: Progressing   Problem: Physical Regulation: Goal: Complications related to the disease process, condition or treatment will be avoided or minimized Outcome: Progressing   Problem: Safety: Goal: Ability to remain free from injury will improve Outcome: Progressing   Problem: Self-Concept: Goal: Ability to disclose and discuss suicidal ideas will improve Outcome: Progressing

## 2019-01-23 NOTE — Tx Team (Addendum)
Interdisciplinary Treatment and Diagnostic Plan Update  01/23/2019 Time of Session: 230pm Derrick Romero MRN: [id number]  Principal Diagnosis: <principal problem not specified>  Secondary Diagnoses: Active Problems:   Bipolar disorder, unspecified ([place])   Bipolar I disorder, most recent episode (or current) manic ([place])   Current Medications:  Current Facility-Administered Medications  Medication Dose Route Frequency Provider Last Rate Last Dose  . acetaminophen (TYLENOL) tablet 650 mg  650 mg Oral Q6H PRN Solon Palm R, DO      . albuterol (PROVENTIL HFA;VENTOLIN HFA) 108 (90 Base) MCG/ACT inhaler 2 puff  2 puff Inhalation Q6H PRN Chong Sicilian, DO      . alum & mag hydroxide-simeth (MAALOX/MYLANTA) 200-200-20 MG/5ML suspension 30 mL  30 mL Oral Q4H PRN Solon Palm R, DO      . divalproex (DEPAKOTE) DR tablet 500 mg  500 mg Oral Q12H Clapacs, John T, MD      . fluticasone (FLONASE) 50 MCG/ACT nasal spray 2 spray  2 spray Each Nare Daily Solon Palm R, DO   2 spray at 01/23/19 0900  . hydrOXYzine (ATARAX/VISTARIL) tablet 25 mg  25 mg Oral Q6H PRN Solon Palm R, DO   25 mg at 01/22/19 1719  . loperamide (IMODIUM) capsule 2-4 mg  2-4 mg Oral PRN Chong Sicilian, DO      . LORazepam (ATIVAN) tablet 1 mg  1 mg Oral Q6H PRN Solon Palm R, DO      . magnesium hydroxide (MILK OF MAGNESIA) suspension 30 mL  30 mL Oral Daily PRN Chong Sicilian, DO      . multivitamin with minerals tablet 1 tablet  1 tablet Oral Daily Solon Palm R, DO   1 tablet at 01/23/19 0900  . ondansetron (ZOFRAN-ODT) disintegrating tablet 4 mg  4 mg Oral Q6H PRN Chong Sicilian, DO      . thiamine (VITAMIN B-1) tablet 100 mg  100 mg Oral Daily Solon Palm R, DO   100 mg at 01/23/19 0900   PTA Medications: Medications Prior to Admission  Medication Sig Dispense Refill Last Dose  . albuterol (PROVENTIL HFA;VENTOLIN HFA) 108 (90 Base) MCG/ACT inhaler Inhale 2 puffs into the lungs every 6 (six) hours as needed  for wheezing or shortness of breath. 1 Inhaler 2 Past Month at Unknown time  . Fexofenadine-Pseudoephedrine (ALLEGRA-D 24 HOUR PO) Take by mouth.   Past Week at Unknown time  . XIAFLEX 0.9 MG SOLR    Past Month at Unknown time  . fluticasone (FLONASE) 50 MCG/ACT nasal spray Place into both nostrils daily.   Not Taking at Unknown time  . Misc Natural Products (GLUCOSAMINE CHONDROITIN ADV) TABS Take by mouth.   Not Taking at Unknown time  . psyllium (METAMUCIL) 58.6 % packet Take 1 packet by mouth daily.   Not Taking at Unknown time    Patient Stressors: Marital or family conflict Substance abuse Other: Mood instability   Patient Strengths: Ability for insight Active sense of humor Average or above average intelligence Capable of independent living Occupational psychologist fund of knowledge Physical Health Special hobby/interest Supportive family/friends Work skills  Treatment Modalities: Medication Management, Group therapy, Case management,  1 to 1 session with clinician, Psychoeducation, Recreational therapy.   Physician Treatment Plan for Primary Diagnosis: <principal problem not specified> Long Term Goal(s): Improvement in symptoms so as ready for discharge Improvement in symptoms so as ready for discharge   Short Term Goals: Ability to identify changes in lifestyle  to reduce recurrence of condition will improve Ability to demonstrate self-control will improve Ability to maintain clinical measurements within normal limits will improve Compliance with prescribed medications will improve Ability to identify triggers associated with substance abuse/mental health issues will improve Ability to demonstrate self-control will improve Ability to identify and develop effective coping behaviors will improve Ability to maintain clinical measurements within normal limits will improve Compliance with prescribed medications will improve Ability to identify triggers  associated with substance abuse/mental health issues will improve  Medication Management: Evaluate patient's response, side effects, and tolerance of medication regimen.  Therapeutic Interventions: 1 to 1 sessions, Unit Group sessions and Medication administration.  Evaluation of Outcomes: Progressing  Physician Treatment Plan for Secondary Diagnosis: Active Problems:   Bipolar disorder, unspecified ([place])   Bipolar I disorder, most recent episode (or current) manic ([place])  Long Term Goal(s): Improvement in symptoms so as ready for discharge Improvement in symptoms so as ready for discharge   Short Term Goals: Ability to identify changes in lifestyle to reduce recurrence of condition will improve Ability to demonstrate self-control will improve Ability to maintain clinical measurements within normal limits will improve Compliance with prescribed medications will improve Ability to identify triggers associated with substance abuse/mental health issues will improve Ability to demonstrate self-control will improve Ability to identify and develop effective coping behaviors will improve Ability to maintain clinical measurements within normal limits will improve Compliance with prescribed medications will improve Ability to identify triggers associated with substance abuse/mental health issues will improve     Medication Management: Evaluate patient's response, side effects, and tolerance of medication regimen.  Therapeutic Interventions: 1 to 1 sessions, Unit Group sessions and Medication administration.  Evaluation of Outcomes: Progressing   RN Treatment Plan for Primary Diagnosis: <principal problem not specified> Long Term Goal(s): Knowledge of disease and therapeutic regimen to maintain health will improve  Short Term Goals: Ability to verbalize frustration and anger appropriately will improve, Ability to demonstrate self-control, Ability to participate in decision making will  improve, Ability to disclose and discuss suicidal ideas, Ability to identify and develop effective coping behaviors will improve and Compliance with prescribed medications will improve  Medication Management: RN will administer medications as ordered by provider, will assess and evaluate patient's response and provide education to patient for prescribed medication. RN will report any adverse and/or side effects to prescribing provider.  Therapeutic Interventions: 1 on 1 counseling sessions, Psychoeducation, Medication administration, Evaluate responses to treatment, Monitor vital signs and CBGs as ordered, Perform/monitor CIWA, COWS, AIMS and Fall Risk screenings as ordered, Perform wound care treatments as ordered.  Evaluation of Outcomes: Progressing   LCSW Treatment Plan for Primary Diagnosis: <principal problem not specified> Long Term Goal(s): Safe transition to appropriate next level of care at discharge, Engage patient in therapeutic group addressing interpersonal concerns.  Short Term Goals: Engage patient in aftercare planning with referrals and resources  Therapeutic Interventions: Assess for all discharge needs, 1 to 1 time with Social worker, Explore available resources and support systems, Assess for adequacy in community support network, Educate family and significant other(s) on suicide prevention, Complete Psychosocial Assessment, Interpersonal group therapy.  Evaluation of Outcomes: Progressing   Progress in Treatment: Attending groups: Yes. Participating in groups: Yes. Taking medication as prescribed: No. pt has refused medication and request education on the medication before taking it and how it will react to medication he is taking for his right hand. Toleration medication: No. Family/Significant other contact made: No, will contact:  pt declined Patient  understands diagnosis: Yes. Discussing patient identified problems/goals with staff: Yes. Medical problems  stabilized or resolved: Yes. Denies suicidal/homicidal ideation: Yes. Issues/concerns per patient self-inventory: No. Other: NA  New problem(s) identified: No, Describe:  none reported  New Short Term/Long Term Goal(s): "To learn about myself, spend more time listening and identify my triggers"  Patient Goals:  "To learn about myself, spend more time listening and identify my triggers"  Discharge Plan or Barriers: Pt will return home and follow up with outpatient treatment  Reason for Continuation of Hospitalization: Medication stabilization  Estimated Length of Stay: 3-5 days  Recreational Therapy: Patient Stressors: N/A Patient Goal: Patient will engage in groups without prompting or encouragement from LRT x3 group sessions within 5 recreation therapy group sessions   Attendees: Patient: Derrick Romero 01/23/2019 3:23 PM  Physician: Alethia Berthold 01/23/2019 3:23 PM  Nursing: Lyda Kalata 01/23/2019 3:23 PM  RN Care Manager: 01/23/2019 3:23 PM  Social Worker: Sanjuana Kava Olivia Moton Ethel Rana 01/23/2019 3:23 PM  Recreational Therapist: Roanna Epley 01/23/2019 3:23 PM  Other:  01/23/2019 3:23 PM  Other:  01/23/2019 3:23 PM  Other: 01/23/2019 3:23 PM    Scribe for Treatment Team: Yvette Rack, LCSW 01/23/2019 3:23 PM

## 2019-01-23 NOTE — Progress Notes (Signed)
Recreation Therapy Notes  INPATIENT RECREATION THERAPY ASSESSMENT  Patient Details Name: Derrick Romero MRN: [id number] DOB: [birth year] Today's Date: 01/23/2019       Information Obtained From: Patient  Able to Participate in Assessment/Interview: Yes  Patient Presentation: Responsive  Reason for Admission (Per Patient): Active Symptoms, Substance Abuse  Patient Stressors:    Coping Skills:   Talk, Music  Leisure Interests (2+):  Music - Listen, Music - Play instrument, Nature - Hunting, Nature - [employer] of Recreation/Participation: Monthly  Awareness of Community Resources:     [employer]:     Current Use:    If no, Barriers?:    Expressed Interest in [place] of Residence:  Insurance underwriter  Patient Main Form of Transportation: Car  Patient Strengths:  Being a father  Patient Identified Areas of Improvement:  listening  Patient Goal for Hospitalization:  To learn a little bit about myself and listen and identify triggers while developing a plan to move forward  Current SI (including self-harm):  No  Current HI:  No  Current AVH: No  Staff Intervention Plan: Group Attendance, Collaborate with Interdisciplinary Treatment Team  Consent to Intern Participation: N/A  [name] 01/23/2019, 3:49 PM

## 2019-01-23 NOTE — Progress Notes (Signed)
Recreation Therapy Notes  Date: 01/23/2019  Time: 9:30 am  Location: Craft Room  Behavioral response: Appropriate  Intervention Topic: Goals  Discussion/Intervention:  Group content on today was focused on goals. Patients described what goals are and how they define goals. Individuals expressed how they go about setting goals and reaching them. The group identified how important goals are and if they make short term goals to reach long term goals. Patients described how many goals they work on at a time and what affects them not reaching their goal. Individuals described how much time they put into planning and obtaining their goals. The group participated in the intervention "My Goal Board" and made personal goal boards to help them achieve their goal. Clinical Observations/Feedback:  Patient came to group and defined goals as something you want to accomplish in a time line. He explained that he makes goals by making a list. Participant expressed that he makes goals on a daily and weekly. Individual was social with peers and staff while participating in group.  Alizza Sacra LRT/CTRS         Demontez Novack 01/23/2019 11:07 AM

## 2019-01-23 NOTE — Progress Notes (Signed)
Patient refused his Depakote because he stated to this writer that he does not have enough information about the medication. Patient also stated that he needs to know if Depakote will interact with his Zanaflex injections in his right hand. This Probation officer will notify MD.

## 2019-01-23 NOTE — Progress Notes (Signed)
[facility] MD Progress Note  01/23/2019 4:03 PM Derrick Romero  MRN:  [id number] Subjective: Follow-up for this gentleman who appears to have bipolar disorder.  Patient seen chart reviewed.  Patient met with treatment team.  I spoke with his wife on the phone as well.  Patient came into the hospital intoxicated but also with a history of agitation hyperactivity mood swings irritability anger and threats that had been going on prior to admission.  Wife tells me the story that sounds consistent with what she told the doctor over the weekend that the patient has had probably at least several weeks of agitation hyperactivity mood lability excessive spending.  Also that this has been a cycle for years.  On interview today the patient has minimal insight.  Does not feel he has a major mood disorder.  Denies any psychotic symptoms.  Denies suicidal or homicidal ideation.  Admits that he was drinking but tends to downplay it. Principal Problem: Bipolar I disorder, most recent episode (or current) manic ([place]) Diagnosis: Principal Problem:   Bipolar I disorder, most recent episode (or current) manic ([place]) Active Problems:   Bipolar disorder, unspecified ([place])  Total Time spent with patient: 30 minutes  Past Psychiatric History: Patient has never seen a psychiatrist as far as we know although apparently someone gave him a diagnosis of bipolar several years ago.  Never been on any medication or in the hospital before.  No known suicide attempts  Past Medical History:  Past Medical History:  Diagnosis Date  . Allergic rhinitis     Past Surgical History:  Procedure Laterality Date  . COLONOSCOPY WITH PROPOFOL N/A 01/12/2019   Procedure: COLONOSCOPY WITH PROPOFOL;  Surgeon: Virgel Manifold, MD;  Location: [facility] ENDOSCOPY;  Service: Endoscopy;  Laterality: N/A;   Family History:  Family History  Problem Relation Age of Onset  . Asthma Father   . Melanoma Father   . Cancer Maternal Grandmother   . Heart  disease Paternal Grandfather    Family Psychiatric  History: None known Social History:  Social History   Substance and Sexual Activity  Alcohol Use Yes   Comment: occasional     Social History   Substance and Sexual Activity  Drug Use No    Social History   Socioeconomic History  . Marital status: Married    Spouse name: Not on file  . Number of children: 2  . Years of education: 62  . Highest education level: Not on file  Occupational History  . Occupation: Facilities manager  Social Needs  . Financial resource strain: Not on file  . Food insecurity:    Worry: Not on file    Inability: Not on file  . Transportation needs:    Medical: Not on file    Non-medical: Not on file  Tobacco Use  . Smoking status: Never Smoker  . Smokeless tobacco: Never Used  Substance and Sexual Activity  . Alcohol use: Yes    Comment: occasional  . Drug use: No  . Sexual activity: Yes    Partners: Female  Lifestyle  . Physical activity:    Days per week: Not on file    Minutes per session: Not on file  . Stress: Not on file  Relationships  . Social connections:    Talks on phone: Not on file    Gets together: Not on file    Attends religious service: Not on file    Active member of club or organization: Not on  file    Attends meetings of clubs or organizations: Not on file    Relationship status: Not on file  Other Topics Concern  . Not on file  Social History Narrative  . Not on file   Additional Social History:                         Sleep: Poor  Appetite:  Fair  Current Medications: Current Facility-Administered Medications  Medication Dose Route Frequency Provider Last Rate Last Dose  . acetaminophen (TYLENOL) tablet 650 mg  650 mg Oral Q6H PRN Solon Palm R, DO      . albuterol (PROVENTIL HFA;VENTOLIN HFA) 108 (90 Base) MCG/ACT inhaler 2 puff  2 puff Inhalation Q6H PRN Chong Sicilian, DO      . alum & mag hydroxide-simeth (MAALOX/MYLANTA)  200-200-20 MG/5ML suspension 30 mL  30 mL Oral Q4H PRN Solon Palm R, DO      . divalproex (DEPAKOTE) DR tablet 500 mg  500 mg Oral Q12H ,  T, MD      . fluticasone (FLONASE) 50 MCG/ACT nasal spray 2 spray  2 spray Each Nare Daily Solon Palm R, DO   2 spray at 01/23/19 0900  . hydrOXYzine (ATARAX/VISTARIL) tablet 25 mg  25 mg Oral Q6H PRN Solon Palm R, DO   25 mg at 01/22/19 1719  . loperamide (IMODIUM) capsule 2-4 mg  2-4 mg Oral PRN Chong Sicilian, DO      . LORazepam (ATIVAN) tablet 1 mg  1 mg Oral Q6H PRN Solon Palm R, DO      . magnesium hydroxide (MILK OF MAGNESIA) suspension 30 mL  30 mL Oral Daily PRN Chong Sicilian, DO      . multivitamin with minerals tablet 1 tablet  1 tablet Oral Daily Solon Palm R, DO   1 tablet at 01/23/19 0900  . ondansetron (ZOFRAN-ODT) disintegrating tablet 4 mg  4 mg Oral Q6H PRN Chong Sicilian, DO      . thiamine (VITAMIN B-1) tablet 100 mg  100 mg Oral Daily Solon Palm R, DO   100 mg at 01/23/19 0900    Lab Results: No results found for this or any previous visit (from the past 48 hour(s)).  Blood Alcohol level:  Lab Results  Component Value Date   ETH 128 (H) 27/05/8674    Metabolic Disorder Labs: Lab Results  Component Value Date   HGBA1C 5.9 (H) 01/20/2019   MPG 122.63 01/20/2019   No results found for: PROLACTIN Lab Results  Component Value Date   CHOL 219 (H) 12/27/2018   TRIG 87 12/27/2018   HDL 59 12/27/2018   CHOLHDL 3.7 12/27/2018   LDLCALC 143 (H) 12/27/2018    Physical Findings: AIMS:  , ,  ,  ,    CIWA:  CIWA-Ar Total: 0 COWS:     Musculoskeletal: Strength & Muscle Tone: within normal limits Gait & Station: normal Patient leans: N/A  Psychiatric Specialty Exam: Physical Exam  Nursing note and vitals reviewed. Constitutional: He appears well-developed and well-nourished.  HENT:  Head: Normocephalic and atraumatic.  Eyes: Pupils are equal, round, and reactive to light. Conjunctivae are  normal.  Neck: Normal range of motion.  Cardiovascular: Regular rhythm and normal heart sounds.  Respiratory: Effort normal. No respiratory distress.  GI: Soft.  Musculoskeletal: Normal range of motion.  Neurological: He is alert.  Skin: Skin is warm and dry.  Psychiatric: His affect is labile. His  speech is rapid and/or pressured and tangential. He is agitated. He is not aggressive. Thought content is paranoid. Cognition and memory are impaired. He expresses impulsivity. He expresses no homicidal and no suicidal ideation.    Review of Systems  Constitutional: Negative.   HENT: Negative.   Eyes: Negative.   Respiratory: Negative.   Cardiovascular: Negative.   Gastrointestinal: Negative.   Musculoskeletal: Negative.   Skin: Negative.   Neurological: Negative.   Psychiatric/Behavioral: Negative.     Blood pressure 135/77, pulse 68, temperature 98.3 F (36.8 C), temperature source Oral, resp. rate 18, height '5\' 10"'  (1.778 m), weight 71.7 kg, SpO2 100 %.Body mass index is 22.67 kg/m.  General Appearance: Casual  Eye Contact:  Fair  Speech:  Pressured  Volume:  Increased  Mood:  Irritable  Affect:  Labile  Thought Process:  Coherent  Orientation:  Full (Time, Place, and Person)  Thought Content:  Rumination and Tangential  Suicidal Thoughts:  No  Homicidal Thoughts:  No  Memory:  Immediate;   Fair Recent;   Fair Remote;   Fair  Judgement:  Impaired  Insight:  Shallow  Psychomotor Activity:  Normal  Concentration:  Concentration: Fair  Recall:  [employer] of Knowledge:  Fair  Language:  Fair  Akathisia:  No  Handed:  Right  AIMS (if indicated):     Assets:  Armed forces logistics/support/administrative officer Housing Physical Health Resilience Social Support  ADL's:  Intact  Cognition:  WNL  Sleep:  Number of Hours: 6.45     Treatment Plan Summary: Daily contact with patient to assess and evaluate symptoms and progress in treatment, Medication management and Plan Spent some time talking with  him about my agreement with a diagnosis of bipolar disorder.  His current presentation shows hyper verbality tangential thinking with flight of ideas, some grandiosity, but does not show any actual psychosis.  Denies suicidal or homicidal ideation.  Patient refused my suggestion that he take medicine.  His first gambit was to ask me to give him a bill that showed a running total of how much money he was being charged in the hospital.  I have no idea whether this is even possible but do not have the ability of course to do it myself.  Later evidently he gave the reason to the nurse that he thought that it would interact with the injections he had in his hand.  Wife tells me that she is not likely to will take him back if he is not on treatment.  We will continue to try and encourage him to get on the medication while monitoring his progress in including him in individual and group therapy.  Alethia Berthold, MD 01/23/2019, 4:03 PM

## 2019-01-23 NOTE — [facility] Group Notes (Signed)
LCSW Group Therapy Note   01/23/2019 1:00 PM  Type of Therapy and Topic:  Group Therapy:  Overcoming Obstacles   Participation Level:  Active   Description of Group:    In this group patients will be encouraged to explore what they see as obstacles to their own wellness and recovery. They will be guided to discuss their thoughts, feelings, and behaviors related to these obstacles. The group will process together ways to cope with barriers, with attention given to specific choices patients can make. Each patient will be challenged to identify changes they are motivated to make in order to overcome their obstacles. This group will be process-oriented, with patients participating in exploration of their own experiences as well as giving and receiving support and challenge from other group members.   Therapeutic Goals: 1. Patient will identify personal and current obstacles as they relate to admission. 2. Patient will identify barriers that currently interfere with their wellness or overcoming obstacles.  3. Patient will identify feelings, thought process and behaviors related to these barriers. 4. Patient will identify two changes they are willing to make to overcome these obstacles:      Summary of Patient Progress Patient arrived late for group.  Patient did not identify his current obstacles, however expressed frustration at not being discharged and it taking days for him to have access to his brace for his hand.  Patient did express some frustration at not having his medications explained to him.     Therapeutic Modalities:   Cognitive Behavioral Therapy Solution Focused Therapy Motivational Interviewing Relapse Prevention Therapy  Assunta Curtis, MSW, LCSW 01/23/2019 12:45 PM

## 2019-01-24 NOTE — Progress Notes (Signed)
Recreation Therapy Notes    Date: 01/24/2019  Time: 9:30 am  Location: Craft Room  Behavioral response: Appropriate  Intervention Topic: Values  Discussion/Intervention:  Group content today was focused on values. The group identified what values are and where they come from. Individuals expressed some values and how many they have. Patients described how they go about add or removing values. The group described the importance of having values and how they go about using them in daily life. Patient participated in the intervention "My Values" where they were able to pick out values that were important to them and make a visual aide.  Clinical Observations/Feedback:  Patient came to group and explained that his values come from his parents and his life experiences. He identified family as being an important values to him. Individual was social with peers and staff while participating in group.  Yanique Mulvihill LRT/CTRS           Kelten Enochs 01/24/2019 11:11 AM

## 2019-01-24 NOTE — Plan of Care (Signed)
Patient stated that he is feeling better and is hopeful that he will be getting out of here soon.   Problem: Education: Goal: Emotional status will improve Outcome: Progressing Goal: Mental status will improve Outcome: Progressing

## 2019-01-24 NOTE — Progress Notes (Signed)
D - Patient was in his room upon arrival to the unit. Patient was pleasant during assessment and medication administration. Patient denies SI/HI/AVH, pain, anxiety and depression with this Probation officer. Patient refused his evening medication stating, "I want to talk to my doctor tomorrow before taking this medication."   A - Patient was compliant with procedures on the unit. Patient given education. Patient given support and encouragement to be active in his treatment plan. Patient informed to let staff know if there are any issues or problems on the unit.   R - Patient being monitored Q 15 minuets for safety per unit protocol. Patient remains safe on the unit.

## 2019-01-24 NOTE — Progress Notes (Signed)
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  Patient seen chart reviewed.  Patient has consistently refused ordered psychiatric medicine since yesterday.  Today he has no new specific complaint other than wanting to ensure that he can be discharged in time to follow-up for his hand appointment.  Says his mood feels fine.  Denies being concerned about his sleep.  Denies auditory or visual hallucinations.  Denies suicidal or homicidal ideation.  His rationale for refusing medicine is that he is concerned about it interacting with the medicine that has been injected into his hand.  I told him that I did not think that that was a concern.  He said that he insists on following up with the orthopedist first before even considering it.  I pointed out to him that this was an irrational objection but he is not willing to concede the point.  I told him about our of my conversation with his wife yesterday and he appeared unconcerned. Principal Problem: Bipolar I disorder, most recent episode (or current) manic ([place]) Diagnosis: Principal Problem:   Bipolar I disorder, most recent episode (or current) manic ([place]) Active Problems:   Bipolar disorder, unspecified ([place])  Total Time spent with patient: 30 minutes  Past Psychiatric History: We are told that at some time in the past there had been someone who offer the opinion that he had bipolar disorder but there is no previous hospitalization and no previous medication treatment.  Wife states that he has had mood swings on a periodic basis for a long time  Past Medical History:  Past Medical History:  Diagnosis Date  . Allergic rhinitis     Past Surgical History:  Procedure Laterality Date  . COLONOSCOPY WITH PROPOFOL N/A 01/12/2019   Procedure: COLONOSCOPY WITH PROPOFOL;  Surgeon: Virgel Manifold, MD;  Location: [facility] ENDOSCOPY;  Service: Endoscopy;   Laterality: N/A;   Family History:  Family History  Problem Relation Age of Onset  . Asthma Father   . Melanoma Father   . Cancer Maternal Grandmother   . Heart disease Paternal Grandfather    Family Psychiatric  History: None known Social History:  Social History   Substance and Sexual Activity  Alcohol Use Yes   Comment: occasional     Social History   Substance and Sexual Activity  Drug Use No    Social History   Socioeconomic History  . Marital status: Married    Spouse name: Not on file  . Number of children: 2  . Years of education: 84  . Highest education level: Not on file  Occupational History  . Occupation: Facilities manager  Social Needs  . Financial resource strain: Not on file  . Food insecurity:    Worry: Not on file    Inability: Not on file  . Transportation needs:    Medical: Not on file    Non-medical: Not on file  Tobacco Use  . Smoking status: Never Smoker  . Smokeless tobacco: Never Used  Substance and Sexual Activity  . Alcohol use: Yes    Comment: occasional  . Drug use: No  . Sexual activity: Yes    Partners: Female  Lifestyle  . Physical activity:    Days per week: Not on file    Minutes per session: Not on file  . Stress: Not on file  Relationships  . Social connections:    Talks on  phone: Not on file    Gets together: Not on file    Attends religious service: Not on file    Active member of club or organization: Not on file    Attends meetings of clubs or organizations: Not on file    Relationship status: Not on file  Other Topics Concern  . Not on file  Social History Narrative  . Not on file   Additional Social History:                         Sleep: Fair  Appetite:  Fair  Current Medications: Current Facility-Administered Medications  Medication Dose Route Frequency Provider Last Rate Last Dose  . acetaminophen (TYLENOL) tablet 650 mg  650 mg Oral Q6H PRN Solon Palm R, DO      . albuterol  (PROVENTIL HFA;VENTOLIN HFA) 108 (90 Base) MCG/ACT inhaler 2 puff  2 puff Inhalation Q6H PRN Chong Sicilian, DO      . alum & mag hydroxide-simeth (MAALOX/MYLANTA) 200-200-20 MG/5ML suspension 30 mL  30 mL Oral Q4H PRN Solon Palm R, DO      . divalproex (DEPAKOTE) DR tablet 500 mg  500 mg Oral Q12H Ashlay Altieri T, MD      . fluticasone (FLONASE) 50 MCG/ACT nasal spray 2 spray  2 spray Each Nare Daily Solon Palm R, DO   2 spray at 01/24/19 [phone]  . magnesium hydroxide (MILK OF MAGNESIA) suspension 30 mL  30 mL Oral Daily PRN Chong Sicilian, DO      . multivitamin with minerals tablet 1 tablet  1 tablet Oral Daily Solon Palm R, DO   1 tablet at 01/24/19 6333  . thiamine (VITAMIN B-1) tablet 100 mg  100 mg Oral Daily Solon Palm R, DO   100 mg at 01/24/19 5456    Lab Results: No results found for this or any previous visit (from the past 48 hour(s)).  Blood Alcohol level:  Lab Results  Component Value Date   ETH 128 (H) 25/63/8937    Metabolic Disorder Labs: Lab Results  Component Value Date   HGBA1C 5.9 (H) 01/20/2019   MPG 122.63 01/20/2019   No results found for: PROLACTIN Lab Results  Component Value Date   CHOL 219 (H) 12/27/2018   TRIG 87 12/27/2018   HDL 59 12/27/2018   CHOLHDL 3.7 12/27/2018   LDLCALC 143 (H) 12/27/2018    Physical Findings: AIMS:  , ,  ,  ,    CIWA:  CIWA-Ar Total: 0 COWS:     Musculoskeletal: Strength & Muscle Tone: within normal limits Gait & Station: normal Patient leans: N/A  Psychiatric Specialty Exam: Physical Exam  Nursing note and vitals reviewed. Constitutional: He appears well-developed and well-nourished.  HENT:  Head: Normocephalic and atraumatic.  Eyes: Pupils are equal, round, and reactive to light. Conjunctivae are normal.  Neck: Normal range of motion.  Cardiovascular: Regular rhythm and normal heart sounds.  Respiratory: Effort normal. No respiratory distress.  GI: Soft.  Musculoskeletal: Normal range of  motion.  Neurological: He is alert.  Skin: Skin is warm and dry.  Psychiatric: His mood appears anxious. His speech is rapid and/or pressured. He is not agitated. Thought content is paranoid. Cognition and memory are normal. He expresses inappropriate judgment. He expresses no homicidal and no suicidal ideation.    Review of Systems  Constitutional: Negative.   HENT: Negative.   Eyes: Negative.   Respiratory: Negative.   Cardiovascular: Negative.  Gastrointestinal: Negative.   Musculoskeletal: Negative.   Skin: Negative.   Neurological: Negative.   Psychiatric/Behavioral: Negative.     Blood pressure 113/87, pulse 63, temperature 98.5 F (36.9 C), temperature source Oral, resp. rate 18, height 5\' 10"  (1.778 m), weight 71.7 kg, SpO2 99 %.Body mass index is 22.67 kg/m.  General Appearance: Fairly Groomed  Eye Contact:  Good  Speech:  Clear and Coherent  Volume:  Increased  Mood:  Euthymic  Affect:  Constricted  Thought Process:  Coherent  Orientation:  Full (Time, Place, and Person)  Thought Content:  Tangential  Suicidal Thoughts:  No  Homicidal Thoughts:  No  Memory:  Immediate;   Fair Recent;   Fair Remote;   Fair  Judgement:  Impaired  Insight:  Shallow  Psychomotor Activity:  Restlessness  Concentration:  Concentration: Fair  Recall:  [employer] of Knowledge:  Fair  Language:  Fair  Akathisia:  No  Handed:  Right  AIMS (if indicated):     Assets:  Housing Physical Health Resilience  ADL's:  Intact  Cognition:  WNL  Sleep:  Number of Hours: 6     Treatment Plan Summary: Daily contact with patient to assess and evaluate symptoms and progress in treatment, Medication management and Plan Patient continues to show some signs of hypomania but has not shown any obvious signs of psychosis.  He is taking care of his basic ADLs.  He is not aggressive or threatening and denies suicidal or homicidal thoughts.  Wife has confirmed that the firearm is out of the house.   Patient continues to refuse to consider bipolar medications.  At this point however I do not think there is really a clear justification for forcing medicine as he does not appear to be psychotic and appears to have the cognitive ability to take care of his basic needs outside the hospital and is not acutely dangerous.  For that reason we are planning on discharge tomorrow with recommended follow-up through local mental health which will be given to the patient at discharge  Alethia Berthold, MD 01/24/2019, 4:09 PM

## 2019-01-24 NOTE — Progress Notes (Signed)
Pt denies SI, HI, depression and anxiety. He attended all of the group meetings today. Collier Bullock Rn

## 2019-01-24 NOTE — Plan of Care (Signed)
  Pt denies SI, HI , anxiety and depression. Pt refused his depakote because "it might interfere with my treatment on my hand". Pt was eagerly waiting for groups.   Collier Bullock Rn    Problem: Education: Goal: Emotional status will improve Outcome: Progressing   Problem: Health Behavior/Discharge Planning: Goal: Compliance with treatment plan for underlying cause of condition will improve Outcome: Not Progressing   Problem: Safety: Goal: Periods of time without injury will increase Outcome: Progressing   Problem: Physical Regulation: Goal: Complications related to the disease process, condition or treatment will be avoided or minimized Outcome: Progressing   Problem: Self-Concept: Goal: Ability to disclose and discuss suicidal ideas will improve Outcome: Progressing

## 2019-01-24 NOTE — BHH Group Notes (Signed)
Feelings Around Diagnosis 01/24/2019 1PM  Type of Therapy/Topic:  Group Therapy:  Feelings about Diagnosis  Participation Level:  Active   Description of Group:   This group will allow patients to explore their thoughts and feelings about diagnoses they have received. Patients will be guided to explore their level of understanding and acceptance of these diagnoses. Facilitator will encourage patients to process their thoughts and feelings about the reactions of others to their diagnosis and will guide patients in identifying ways to discuss their diagnosis with significant others in their lives. This group will be process-oriented, with patients participating in exploration of their own experiences, giving and receiving support, and processing challenge from other group members.   Therapeutic Goals: 1. Patient will demonstrate understanding of diagnosis as evidenced by identifying two or more symptoms of the disorder 2. Patient will be able to express two feelings regarding the diagnosis 3. Patient will demonstrate their ability to communicate their needs through discussion and/or role play  Summary of Patient Progress:  Actively and appropriately engaged in the group. Patient was able to provide support and validation to other group members.Patient practiced active listening when interacting with the facilitator and other group members. Patient expressed his feelings and thoughts about diagnosis and identified ways to educate his support system on his diagnosis.       Therapeutic Modalities:   Cognitive Behavioral Therapy Brief Therapy Feelings Identification    Yvette Rack, LCSW 01/24/2019 2:19 PM

## 2019-01-25 NOTE — Plan of Care (Addendum)
Pt is calm and cooperative. Pt still refuses depakote. MD aware. Pt has rapid speech at times. Pt is wout injury to self or others and no threats. Pt looks forward to d/c to attend to his lifestyle and positive coping skills. Pt denies SI, Hi, depression and anxiety. Collier Bullock Rn  Problem: Education: Goal: Emotional status will improve Outcome: Progressing Goal: Mental status will improve Outcome: Adequate for Discharge   Problem: Safety: Goal: Periods of time without injury will increase Outcome: Progressing   Problem: Health Behavior/Discharge Planning: Goal: Ability to identify changes in lifestyle to reduce recurrence of condition will improve Outcome: Progressing   Problem: Physical Regulation: Goal: Complications related to the disease process, condition or treatment will be avoided or minimized Outcome: Progressing   Problem: Safety: Goal: Ability to remain free from injury will improve Outcome: Progressing   Problem: Self-Concept: Goal: Ability to disclose and discuss suicidal ideas will improve Outcome: Progressing

## 2019-01-25 NOTE — Progress Notes (Signed)
Recreation Therapy Notes   Date: 01/25/2019  Time: 9:30 am  Location: Craft Room  Behavioral response: Appropriate  Intervention Topic: Emotions  Discussion/Intervention:  Group content on today was focused on emotions. The group identified what emotions are and why it is important to have emotions. Patients expressed some positive and negative emotions. Individuals gave some past experiences on how they normally dealt with emotions in the past. The group described some positive ways to deal with emotions in the future. Patients participated in the intervention "Name the Derrick Romero" where individuals were given a chance to experience different emotions.  Clinical Observations/Feedback:  Patient came to group and defined emotions as how you feel. He identified happiness and content as his normal emotions. Participant explained with all that is going on in the world he now feels fear and concern a lot. Patient explained that his emotion normally goes off what is going on in his life at the time. He identified sleep, coffee and music as things that affect his emotions. Individual was social with peers and staff while participating in group.  Derrick Romero LRT/CTRS          [name] 01/25/2019 11:06 AM

## 2019-01-25 NOTE — Progress Notes (Signed)
  [facility] Adult Case Management Discharge Plan :  Will you be returning to the same living situation after discharge:  Yes,  with wife and children At discharge, do you have transportation home?: Yes,  wife will pick up Do you have the ability to pay for your medications: Yes,  insurance  Release of information consent forms completed and in the chart;  Patient's signature needed at discharge.  Patient to Follow up at: Follow-up Information    [facility] Follow up on 02/01/2019.   Why:  Please follow up with [facility] on Tuesday, April 1st at 9:30am.  Please bring your ID, insurance card and discharge paperwork from this hospital stay with you. Contact information: [place] [address] [phone]           Next level of care provider has access to [place] and Suicide Prevention discussed: Yes,  with pt; declined family contact  Have you used any form of tobacco in the last 30 days? (Cigarettes, Smokeless Tobacco, Cigars, and/or Pipes): No  Has patient been referred to the Quitline?: N/A patient is not a smoker  Patient has been referred for addiction treatment: N/A  Yvette Rack, LCSW 01/25/2019, 9:42 AM

## 2019-01-25 NOTE — BHH Suicide Risk Assessment (Signed)
[facility] Discharge Suicide Risk Assessment   Principal Problem: Bipolar I disorder, most recent episode (or current) manic ([place]) Discharge Diagnoses: Principal Problem:   Bipolar I disorder, most recent episode (or current) manic ([place]) Active Problems:   Bipolar disorder, unspecified ([place])   Total Time spent with patient: 45 minutes  Musculoskeletal: Strength & Muscle Tone: within normal limits Gait & Station: normal Patient leans: N/A  Psychiatric Specialty Exam: Review of Systems  Constitutional: Negative.   HENT: Negative.   Eyes: Negative.   Respiratory: Negative.   Cardiovascular: Negative.   Gastrointestinal: Negative.   Musculoskeletal: Negative.   Skin: Negative.   Neurological: Negative.   Psychiatric/Behavioral: Negative.     Blood pressure 121/83, pulse (!) 57, temperature 98 F (36.7 C), temperature source Oral, resp. rate 15, height 5\' 10"  (1.778 m), weight 71.7 kg, SpO2 100 %.Body mass index is 22.67 kg/m.  General Appearance: Fairly Groomed  Engineer, water::  Good  Speech:  Normal Rate409  Volume:  Normal  Mood:  Euthymic  Affect:  Constricted  Thought Process:  Coherent  Orientation:  Full (Time, Place, and Person)  Thought Content:  Rumination and Tangential  Suicidal Thoughts:  No  Homicidal Thoughts:  No  Memory:  Immediate;   Fair Recent;   Fair Remote;   Fair  Judgement:  Impaired  Insight:  Shallow  Psychomotor Activity:  Restlessness  Concentration:  Fair  Recall:  [employer] of Knowledge:Fair  Language: Fair  Akathisia:  No  Handed:  Right  AIMS (if indicated):     Assets:  Desire for Improvement Housing Physical Health Resilience  Sleep:  Number of Hours: 7.75  Cognition: WNL  ADL's:  Intact   Mental Status Per Nursing Assessment::   On Admission:  NA  Demographic Factors:  Male, Caucasian and Unemployed  Loss Factors: Financial problems/change in socioeconomic status  Historical Factors: Impulsivity  Risk Reduction  Factors:   Responsible for children under 61 years of age, Sense of responsibility to family, Living with another person, especially a relative and Positive social support  Continued Clinical Symptoms:  Bipolar Disorder:   Bipolar II  Cognitive Features That Contribute To Risk:  Closed-mindedness    Suicide Risk:  Minimal: No identifiable suicidal ideation.  Patients presenting with no risk factors but with morbid ruminations; may be classified as minimal risk based on the severity of the depressive symptoms  Follow-up Information    [place] Follow up on 02/01/2019.   Why:  Please follow up with [facility] on Tuesday, April 1st at 9:30am.  Please bring your ID, insurance card and discharge paperwork from this hospital stay with you. Contact information: [place] [address] [phone]           Plan Of Care/Follow-up recommendations:  Activity:  as tolerated Diet:  regular Other:  follow up with local providers  Alethia Berthold, MD 01/25/2019, 9:24 AM

## 2019-01-25 NOTE — Progress Notes (Signed)
Recreation Therapy Notes  INPATIENT RECREATION TR PLAN  Patient Details Name: Derrick Romero MRN: [id number] DOB: Feb 05, 1961 Today's Date: 01/25/2019  Rec Therapy Plan Is patient appropriate for Therapeutic Recreation?: Yes Treatment times per week: at least 3 Estimated Length of Stay: 5-7 days TR Treatment/Interventions: Group participation (Comment)  Discharge Criteria Pt will be discharged from therapy if:: Discharged Treatment plan/goals/alternatives discussed and agreed upon by:: Patient/family  Discharge Summary Short term goals set: Patient will engage in groups without prompting or encouragement from LRT x3 group sessions within 5 recreation therapy group sessions Short term goals met: Complete Progress toward goals comments: Groups attended Which groups?: Goal setting, Other (Comment)(Emotions, Values) Reason goals not met: N/A Therapeutic equipment acquired: N/A Reason patient discharged from therapy: Discharge from hospital Pt/family agrees with progress & goals achieved: Yes Date patient discharged from therapy: 01/25/19   Shaquia Berkley 01/25/2019, 1:45 PM

## 2019-01-25 NOTE — Plan of Care (Signed)
Patient stated to this writer that he is feeling better. Patient refused medication, see mar, but stated that's because he thinks it will interfere with the healing of his hand. (Contractures)   Problem: Education: Goal: Emotional status will improve Outcome: Progressing Goal: Mental status will improve Outcome: Progressing

## 2019-01-25 NOTE — Progress Notes (Signed)
D - Patient was in his room upon arrival to the unit. Patient was pleasant during assessment and medication administration. Patient denies SI/HI/AVH, pain, anxiety and depression with this Probation officer. Patient refused his evening medication stating, "I don't want it to mess with the healing of my hand."   A - Patient was compliant with procedures on the unit. Patient given education. Patient given support and encouragement to be active in his treatment plan. Patient informed to let staff know if there are any issues or problems on the unit.   R - Patient being monitored Q 15 minuets for safety per unit protocol. Patient remains safe on the unit.

## 2019-01-25 NOTE — Progress Notes (Deleted)
Recreation Therapy Notes  INPATIENT RECREATION THERAPY ASSESSMENT  Patient Details Name: Derrick Romero MRN: [id number] DOB: [birth year] Today's Date: 01/25/2019       Information Obtained From: Patient  Able to Participate in Assessment/Interview: Yes  Patient Presentation: Responsive  Reason for Admission (Per Patient): Active Symptoms, Substance Abuse  Patient Stressors:    Coping Skills:   Talk, Music  Leisure Interests (2+):  Music - Listen, Music - Play instrument, Nature - Hunting, Nature - [employer] of Recreation/Participation: Monthly  Awareness of Community Resources:     [employer]:     Current Use:    If no, Barriers?:    Expressed Interest in [place] of Residence:  Insurance underwriter  Patient Main Form of Transportation: Car  Patient Strengths:  Being a father  Patient Identified Areas of Improvement:  listening  Patient Goal for Hospitalization:  To learn a little bit about myself and listen and identify triggers while developing a plan to move forward  Current SI (including self-harm):  No  Current HI:  No  Current AVH: No  Staff Intervention Plan: Group Attendance, Collaborate with Interdisciplinary Treatment Team  Consent to Intern Participation: N/A  Lyssa Hackley 01/25/2019, 1:44 PM

## 2019-01-25 NOTE — Progress Notes (Signed)
Pt denied SI and was d/c. Pt was given AVS, transition record, suicide assessment and belongings. Pt was taken out to his ride ... his wife by a tech. Collier Bullock Rn

## 2019-01-26 NOTE — Discharge Summary (Signed)
Physician Discharge Summary Note  Patient:  Derrick Romero is an 58 y.o., male MRN:  [id number] DOB:  1960-12-03 Patient phone:  [phone] (home)  Patient address:   [address] [place] [address],  Total Time spent with patient: 45 minutes  Date of Admission:  01/21/2019 Date of Discharge: January 25, 2019  Reason for Admission: Admitted through the emergency room where he presented under involuntary commitment because of his wife's concern that he had become potentially dangerous and made suicidal statements  Principal Problem: Bipolar I disorder, most recent episode (or current) manic [facility]) Discharge Diagnoses: Principal Problem:   Bipolar I disorder, most recent episode (or current) manic ([place]) Active Problems:   Bipolar disorder, unspecified ([place])   Past Psychiatric History: Patient allegedly had been at some point diagnosed with bipolar disorder although he denies any previous medication treatment and denies any previous hospitalization.  Denies any history of suicide attempts.  Past Medical History:  Past Medical History:  Diagnosis Date  . Allergic rhinitis     Past Surgical History:  Procedure Laterality Date  . COLONOSCOPY WITH PROPOFOL N/A 01/12/2019   Procedure: COLONOSCOPY WITH PROPOFOL;  Surgeon: Virgel Manifold, MD;  Location: [facility] ENDOSCOPY;  Service: Endoscopy;  Laterality: N/A;   Family History:  Family History  Problem Relation Age of Onset  . Asthma Father   . Melanoma Father   . Cancer Maternal Grandmother   . Heart disease Paternal Grandfather    Family Psychiatric  History: None reported Social History:  Social History   Substance and Sexual Activity  Alcohol Use Yes   Comment: occasional     Social History   Substance and Sexual Activity  Drug Use No    Social History   Socioeconomic History  . Marital status: Married    Spouse name: Not on file  . Number of children: 2  . Years of education: 64  . Highest education level:  Not on file  Occupational History  . Occupation: Facilities manager  Social Needs  . Financial resource strain: Not on file  . Food insecurity:    Worry: Not on file    Inability: Not on file  . Transportation needs:    Medical: Not on file    Non-medical: Not on file  Tobacco Use  . Smoking status: Never Smoker  . Smokeless tobacco: Never Used  Substance and Sexual Activity  . Alcohol use: Yes    Comment: occasional  . Drug use: No  . Sexual activity: Yes    Partners: Female  Lifestyle  . Physical activity:    Days per week: Not on file    Minutes per session: Not on file  . Stress: Not on file  Relationships  . Social connections:    Talks on phone: Not on file    Gets together: Not on file    Attends religious service: Not on file    Active member of club or organization: Not on file    Attends meetings of clubs or organizations: Not on file    Relationship status: Not on file  Other Topics Concern  . Not on file  Social History Narrative  . Not on file    Hospital Course: Patient admitted to the hospital 15-minute checks in place.  Patient did not show any dangerous or aggressive behavior while in the hospital.  Did not threaten suicide did not try to harm himself.  He maintain proper hygiene.  He was appropriate in his engagement  in both 1 and 1 in group settings.  To my assessment he still seem to be slightly pressured and agitated probably more of a hypomanic presentation.  Tried to convince patient to except medication with Depakote.  He steadfastly refused medication.  There was nothing about him that showed any rationale for forcing medication.  Patient was not showing any acute dangerousness and did not meet commitment criteria any longer.  No point in continued hospitalization.  Patient was allowed to go home but he has been psycho educated and given lots of information about bipolar disorder and very strongly encouraged to follow-up with outpatient mental health  providers as recommended.  Physical Findings: AIMS:  , ,  ,  ,    CIWA:  CIWA-Ar Total: 0 COWS:     Musculoskeletal: Strength & Muscle Tone: within normal limits Gait & Station: normal Patient leans: N/A  Psychiatric Specialty Exam: Physical Exam  Nursing note and vitals reviewed. Constitutional: He appears well-developed and well-nourished.  HENT:  Head: Normocephalic and atraumatic.  Eyes: Pupils are equal, round, and reactive to light. Conjunctivae are normal.  Neck: Normal range of motion.  Cardiovascular: Regular rhythm and normal heart sounds.  Respiratory: Effort normal. No respiratory distress.  GI: Soft.  Musculoskeletal: Normal range of motion.  Neurological: He is alert.  Skin: Skin is warm and dry.  Psychiatric: He has a normal mood and affect. His behavior is normal. Judgment and thought content normal.    Review of Systems  Constitutional: Negative.   HENT: Negative.   Eyes: Negative.   Respiratory: Negative.   Cardiovascular: Negative.   Gastrointestinal: Negative.   Musculoskeletal: Negative.   Skin: Negative.   Neurological: Negative.   Psychiatric/Behavioral: Negative.     Blood pressure 121/83, pulse (!) 57, temperature 98 F (36.7 C), temperature source Oral, resp. rate 15, height 5\' 10"  (1.778 m), weight 71.7 kg, SpO2 100 %.Body mass index is 22.67 kg/m.  General Appearance: Casual  Eye Contact:  Fair  Speech:  Clear and Coherent  Volume:  Normal  Mood:  Euthymic  Affect:  Congruent  Thought Process:  Goal Directed  Orientation:  Full (Time, Place, and Person)  Thought Content:  Logical  Suicidal Thoughts:  No  Homicidal Thoughts:  No  Memory:  Immediate;   Fair Recent;   Fair Remote;   Fair  Judgement:  Fair  Insight:  Fair  Psychomotor Activity:  Normal  Concentration:  Concentration: Fair  Recall:  [place] of Knowledge:  Fair  Language:  Fair  Akathisia:  No  Handed:  Right  AIMS (if indicated):     Assets:  Desire for  Improvement Housing Physical Health  ADL's:  Intact  Cognition:  WNL  Sleep:  Number of Hours: 7.75     Have you used any form of tobacco in the last 30 days? (Cigarettes, Smokeless Tobacco, Cigars, and/or Pipes): No  Has this patient used any form of tobacco in the last 30 days? (Cigarettes, Smokeless Tobacco, Cigars, and/or Pipes) Yes, Yes, A prescription for an FDA-approved tobacco cessation medication was offered at discharge and the patient refused  Blood Alcohol level:  Lab Results  Component Value Date   ETH 128 (H) 00/76/2263    Metabolic Disorder Labs:  Lab Results  Component Value Date   HGBA1C 5.9 (H) 01/20/2019   MPG 122.63 01/20/2019   No results found for: PROLACTIN Lab Results  Component Value Date   CHOL 219 (H) 12/27/2018   TRIG 87 12/27/2018  HDL 59 12/27/2018   CHOLHDL 3.7 12/27/2018   LDLCALC 143 (H) 12/27/2018    See Psychiatric Specialty Exam and Suicide Risk Assessment completed by Attending Physician prior to discharge.  Discharge destination:  Home  Is patient on multiple antipsychotic therapies at discharge:  No   Has Patient had three or more failed trials of antipsychotic monotherapy by history:  No  Recommended Plan for Multiple Antipsychotic Therapies: NA  Discharge Instructions    Diet - low sodium heart healthy   Complete by:  As directed    Increase activity slowly   Complete by:  As directed      Allergies as of 01/25/2019      Reactions   Oak Bark  [quercus Robur] Other (See Comments), Shortness Of Breath      Medication List    STOP taking these medications   Glucosamine Chondroitin Adv Tabs   psyllium 58.6 % packet Commonly known as:  METAMUCIL   Xiaflex 0.9 MG Solr Generic drug:  Collagenase Clostrid Histolyt     TAKE these medications     Indication  albuterol 108 (90 Base) MCG/ACT inhaler Commonly known as:  PROVENTIL HFA;VENTOLIN HFA Inhale 2 puffs into the lungs every 6 (six) hours as needed for wheezing  or shortness of breath.  Indication:  Asthma   ALLEGRA-D 24 HOUR PO Take by mouth.  Indication:  Allergic Rhinitis   fluticasone 50 MCG/ACT nasal spray Commonly known as:  FLONASE Place into both nostrils daily.  Indication:  Allergic Rhinitis      Follow-up Information    [facility] Follow up on 02/01/2019.   Why:  Please follow up with [facility] on Tuesday, April 1st at 9:30am.  Please bring your ID, insurance card and discharge paperwork from this hospital stay with you. Contact information: [place] [address] [phone]           Follow-up recommendations:  Activity:  Activity as tolerated Diet:  Regular diet Other:  Patient has steadfastly refused to take any medications psychiatrically in the hospital.  Therefore there is no logical reason to give him a prescription for psychiatric medicine at discharge given that we will not be there to follow-up or check for tolerance.  Patient has an appointment made and a referral to [facility] for follow-up on April 1 and is agreeable to doing that.  Comments: Patient educated about symptoms of mania and depression and appropriate use of medicine and strongly encouraged to keep his appointment on April 1  Signed: Alethia Berthold, MD 01/26/2019, 6:01 PM

## 2019-02-15 ENCOUNTER — Telehealth: Payer: Self-pay | Admitting: Family Medicine

## 2019-02-15 NOTE — Telephone Encounter (Signed)
Patient scheduled for a doxy.me visit on 02/17/2019

## 2019-02-15 NOTE — Telephone Encounter (Signed)
We can treat anxiety. We could set him up with evisit.  Could still have referral if he wants, but could maybe start some treatment in the meantime.

## 2019-02-15 NOTE — Telephone Encounter (Signed)
Pt called saying he would like a referral to a psychiatrist.  He said with all the Covid 19 and being unemployed he is having some anxiety.   He has [employer]   Dr. Jannifer Hick Fax # [phone]  He is located in [place]

## 2019-02-16 ENCOUNTER — Telehealth: Payer: Self-pay

## 2019-02-16 NOTE — Telephone Encounter (Signed)
Patient wants to proceed with the referral and cancel appt tomorrow.

## 2019-02-16 NOTE — Progress Notes (Deleted)
       Patient: Derrick Romero Male    DOB: 03-06-1961   58 y.o.   MRN: [id number] Visit Date: 02/16/2019  Today's Provider: Lavon Paganini, MD   No chief complaint on file.  Subjective:    Virtual Visit via Video Note  I connected with [name] on 02/16/19 at  9:20 AM EDT by a video enabled telemedicine application and verified that I am speaking with the correct person using two identifiers.   I discussed the limitations of evaluation and management by telemedicine and the availability of in person appointments. The patient expressed understanding and agreed to proceed.  Anxiety  Presents for initial visit. Symptoms include nervous/anxious behavior.    No flowsheet data found.     Allergies  Allergen Reactions  . Oak Bark  [Quercus Robur] Other (See Comments) and Shortness Of Breath     Current Outpatient Medications:  .  albuterol (PROVENTIL HFA;VENTOLIN HFA) 108 (90 Base) MCG/ACT inhaler, Inhale 2 puffs into the lungs every 6 (six) hours as needed for wheezing or shortness of breath., Disp: 1 Inhaler, Rfl: 2 .  Fexofenadine-Pseudoephedrine (ALLEGRA-D 24 HOUR PO), Take by mouth., Disp: , Rfl:  .  fluticasone (FLONASE) 50 MCG/ACT nasal spray, Place into both nostrils daily., Disp: , Rfl:   Review of Systems  Constitutional: Negative.   Respiratory: Negative.   Cardiovascular: Negative.   Musculoskeletal: Negative.   Psychiatric/Behavioral: The patient is nervous/anxious.     Social History   Tobacco Use  . Smoking status: Never Smoker  . Smokeless tobacco: Never Used  Substance Use Topics  . Alcohol use: Yes    Comment: occasional      Objective:   There were no vitals taken for this visit. There were no vitals filed for this visit.   Physical Exam      Assessment & Plan      I discussed the assessment and treatment plan with the patient. The patient was provided an opportunity to ask questions and all were answered. The patient agreed  with the plan and demonstrated an understanding of the instructions.   The patient was advised to call back or seek an in-person evaluation if the symptoms worsen or if the condition fails to improve as anticipated.  I provided *** minutes of non-face-to-face time during this encounter.     Lavon Paganini, MD  [facility]

## 2019-02-17 ENCOUNTER — Encounter: Payer: Self-pay | Admitting: Family Medicine

## 2019-02-17 ENCOUNTER — Ambulatory Visit (INDEPENDENT_AMBULATORY_CARE_PROVIDER_SITE_OTHER): Payer: Managed Care, Other (non HMO) | Admitting: Family Medicine

## 2019-02-17 ENCOUNTER — Ambulatory Visit: Payer: Self-pay | Admitting: Family Medicine

## 2019-02-17 DIAGNOSIS — F3110 Bipolar disorder, current episode manic without psychotic features, unspecified: Secondary | ICD-10-CM | POA: Diagnosis not present

## 2019-02-17 NOTE — Assessment & Plan Note (Signed)
Patient has been previously diagnosed with Bipolar disorder, but never treated He was recently hospitalized with manic episode He seems fairly stable at this time He would consider a medication for mood stabilizer, but wants to ask a lot of questions of the psychiatrist Will refer to Psychiatrist in [place] per patient's request Discussed signs of mania and severe depression for which to seek help

## 2019-02-17 NOTE — Telephone Encounter (Signed)
Needs an appt for a referral

## 2019-02-17 NOTE — Progress Notes (Signed)
Patient: Derrick Romero Male    DOB: 03-26-1961   58 y.o.   MRN: [id number] Visit Date: 02/17/2019  Today's Provider: Lavon Paganini, MD   Chief Complaint  Patient presents with  . Manic Behavior   Subjective:    Virtual Visit via Video Note  I connected withDavid MEL Romero on 02/16/19 at  9:20 AM EDT by a video enabled telemedicine application and verified that I am speaking with the correct person using two identifiers.  I discussed the limitations of evaluation and management by telemedicine and the availability of in person appointments. The patient expressed understanding and agreed to proceed.   Patient location: home Provider location: home office Persons involved in the visit: patient, provider   HPI Bipolar Disorder:  Patient is requesting a referral to psychiatry.   Patient was admitted to [facility] 01/21/19-01/25/19 for bipolar disorder without any threat of suicide.  He seemed to be in hypomanic state by time of discharge.  It was strongly recommended for him to take Depakote, but he refused this treatment.  A f/u appt was made for him on 4/1 with [facility], but he did not attend because he felt like this was more centered around substance abuse treatment.  He states that he asked questions about the Depakote and did not feel like he got satisfactory answers to questions about risks, benefits, and alternatives.  He noticed an extra pill in the daily meds one morning and felt like he hadn't heard anything about it.  He found the name of Dr. Jannifer Hick in [place] who he would like to start seeing for psych care.  He has already reached out to their office and they are expecting a referral.  He is also unemployed and has a lot of medical bills for him and his son.  He is strssed, but feels like he is dealing with it well.   Allergies  Allergen Reactions  . Oak Bark  [Quercus Robur] Other (See Comments) and Shortness Of Breath     Current Outpatient  Medications:  .  albuterol (PROVENTIL HFA;VENTOLIN HFA) 108 (90 Base) MCG/ACT inhaler, Inhale 2 puffs into the lungs every 6 (six) hours as needed for wheezing or shortness of breath., Disp: 1 Inhaler, Rfl: 2 .  Fexofenadine-Pseudoephedrine (ALLEGRA-D 24 HOUR PO), Take by mouth., Disp: , Rfl:  .  fluticasone (FLONASE) 50 MCG/ACT nasal spray, Place into both nostrils daily., Disp: , Rfl:   Review of Systems  Constitutional: Negative.   Respiratory: Negative.   Cardiovascular: Negative.   Psychiatric/Behavioral: Negative for agitation, behavioral problems, dysphoric mood, self-injury and suicidal ideas. The patient is nervous/anxious.     Social History   Tobacco Use  . Smoking status: Never Smoker  . Smokeless tobacco: Never Used  Substance Use Topics  . Alcohol use: Yes    Comment: occasional      Objective:   There were no vitals taken for this visit. There were no vitals filed for this visit.   Physical Exam Constitutional:      Appearance: Normal appearance.  Pulmonary:     Effort: Pulmonary effort is normal. No respiratory distress.  Neurological:     Mental Status: He is alert and oriented to person, place, and time.  Psychiatric:        Attention and Perception: Attention normal.        Mood and Affect: Affect normal. Mood is anxious.        Behavior: Behavior normal.  Thought Content: Thought content does not include homicidal or suicidal ideation. Thought content does not include homicidal or suicidal plan.     Comments: Speech is slightly pressured         Assessment & Plan    I discussed the assessment and treatment plan with the patient. The patient was provided an opportunity to ask questions and all were answered. The patient agreed with the plan and demonstrated an understanding of the instructions.  The patient was advised to call back or seek an in-person evaluation if the symptoms worsen or if the condition fails to improve as anticipated.   Problem List Items Addressed This Visit      Other   Bipolar I disorder, most recent episode (or current) manic ([place]) - Primary    Patient has been previously diagnosed with Bipolar disorder, but never treated He was recently hospitalized with manic episode He seems fairly stable at this time He would consider a medication for mood stabilizer, but wants to ask a lot of questions of the psychiatrist Will refer to Psychiatrist in [place] per patient's request Discussed signs of mania and severe depression for which to seek help      Relevant Orders   Ambulatory referral to Psychiatry       Return if symptoms worsen or fail to improve.   The entirety of the information documented in the History of Present Illness, Review of Systems and Physical Exam were personally obtained by me. Portions of this information were initially documented by Tiburcio Pea, CMA and reviewed by me for thoroughness and accuracy.    Virginia Crews, MD, MPH [facility] 02/17/2019 10:45 AM

## 2019-02-17 NOTE — Telephone Encounter (Signed)
Patient rescheduled for a doxy.me visit today.

## 2019-02-22 ENCOUNTER — Telehealth: Payer: Self-pay

## 2019-02-22 NOTE — Telephone Encounter (Signed)
Patient is calling that he has not heard anything regarding his referral to the psychiatrist and that this not acceptable. Reports that it has been exactly 10 days from the appointment time with the provider and to waiting on someone to call him. Advised patient that the referral has been placed. Patient reports that he is in a "boiling" point at this moment and he expects an email with the answer or the update on when he is going to be seen at.He is expecting an answer in the next 4 hours otherwise he is going to have to take extra measures that we are not going to like.states this is ridiculous and that we should be not letting him wait this long and that is unacceptable. States that he understand that with the Virus is hard but that someone should be doing the job.stated "I expect a phone call,text or e-mail within the next 4 hours"

## 2019-02-22 NOTE — Telephone Encounter (Signed)
Derrick Romero, can you check on the status of his referral for [place]?  It has not been 10 days since it was placed at his visit, by the way.  I did tell the patient that it can take up to 2 weeks for the referral to go through.  There was a specific psychiatrist that he had requested that I put in the comments of the referral.  Thank you!  Also,  I do not like the threatening overtone to this message from him.  For the record.  We will not accept threats.

## 2019-02-22 NOTE — Telephone Encounter (Signed)
Pt was advised that referral has ben faxed to [facility] and he will hear from their office concerning appointment after doctor reviews records. I explained to him that I left message at their office on 02/17/19 when referral was placed to get fax number but wasn't able to speak to anyone until today.Pt was polite during conversation and admits that it has NOT been 10 days since referral was placed but he was trying to get an appointment with them prior to his visit with you

## 2019-04-19 ENCOUNTER — Ambulatory Visit (INDEPENDENT_AMBULATORY_CARE_PROVIDER_SITE_OTHER): Payer: 59 | Admitting: Psychiatry

## 2019-04-19 ENCOUNTER — Other Ambulatory Visit: Payer: Self-pay

## 2019-04-19 ENCOUNTER — Encounter: Payer: Self-pay | Admitting: Psychiatry

## 2019-04-19 VITALS — BP 182/97 | HR 72 | Ht 70.0 in | Wt 165.0 lb

## 2019-04-19 DIAGNOSIS — F3160 Bipolar disorder, current episode mixed, unspecified: Secondary | ICD-10-CM | POA: Diagnosis not present

## 2019-04-19 MED ORDER — DIVALPROEX SODIUM ER 500 MG PO TB24: 500.0000 mg | ORAL_TABLET | Freq: Every day | ORAL | 0 refills | Status: DC

## 2019-04-19 NOTE — Progress Notes (Signed)
[facility] MD/PA/NP Initial Note  04/20/2019 10:09 AM Derrick Romero  MRN:  [id number]  Chief Complaint: Mood disturbance  HPI: Patient is a 58 year old male being seen for initial evaluation after being diagnosed with bipolar disorder after psychiatric hospitalization to [facility] January 21, 2019 through January 25, 2019.  Patient reports that hospitalization was precipitated by conflict between he and his wife which quickly escalated and involved law enforcement after patient made statements indicating possible safety issues.   Patient reports that he declined Depakote during hospitalization since he had questions about risks and benefits and was requesting written information about medication.  He reports that he had a post-discharge f/u call with [facility] and then learned that his insurance would not cover services there.  He reports that he then attempted to establish care at another practice, was scheduled to see a provider, and then later learned that he would not be able to be seen there.  Patient reports that these events have resulted in a delay in treatment.  Patient describes possible longstanding mood signs and symptoms with predominantly lengthy episodes of hypomania. He reports having periods of brief, mild depression in the past. Reports that mood tends to cycle infrequently and mood shifts typically occur with seasonal changes. He states that his wife has commented that she has noticed mood s/s x 20 years. He reports that he has noticed some mood s/s in the last 5 years and that s/s have been gradually worsening.  He estimates that throughout his lifetime he has had about 75% hypomania, 5-10% depression, and the remainder has been euthymia.  He reports that he has had more depression in the last 2 years and had first episode of severe, persistent depression while being unemployed and having some physical issues that prevented him from exercising regularly. Reports that  during period of severe depression he had decreased interest in his usual activities. He reports that his appetite has increased and he gained weight and reached his highest weight ever. He reports that he had low energy and motivation during that time and was sleeping excessively. Reports that he was spending most of the days on the couch. Denies current or past SI or HI.   He reports that since hospitalization he has been structuring his days and trying to limit his projects since he was previously taking on multiple projects. Has also been trying to focus on sleep hygiene and maintaining a regular sleep schedule. He reports that recently his mood has been "upbeat, even positive." Reports sleeping 4-5 hours a night on average. He reports that he typically falls asleep without significant difficulty. Has intermittent and early morning awakenings. Awakens 1-3 am most days and cannot sleep past 4 am. Describes trying different physical activities "to wear myself out." He reports that his energy has been elevated and energy is starting to level off in the last 2 weeks. Motivation has been good and describes taking on multiple projects. Reports some risky and impulsive behavior with "aggressive hiking."  He reports appetite is stable. He reports that his concentration has been ok.   He reports mood has been slightly elevated and irritable with hypomanic s/s. Denies excessive spending. Reports that there have been a few instances of excessive talkativeness. Reports that he has noticed some impulsivity and was approached at past jobs about "toning down the banter" with some other employees.  Possible inflated self-esteem. He reports that he had racing thoughts immediately after discharge and this has decreased. Reports periods of  practicing playing guitar 1-2 hours a day. Intentionally lost 40 lbs since December/January.Reports increase in sexual interest when mood is elevated. Has worked 60-70 hours a week at times.    Denies AH or VH. Denies paranoia. Reports that he will "ocasionally make associations between things that are not really there."   Denies anxiety to include excessive worry, obsessions, or compulsions.   Born and raised in [place]. Has 2 younger brothers. Reports that things "were good" growing up. Has a masters degree in Writer. Currently is unemployed. Works as a Government social research officer. He reports that he had difficulty working due to hand contractures with Dupuytren's. Daughter recently graduated from nursing school in December and is now working as a Marine scientist in [place]. Son just graduated college and will be moving to [place] to take a job. Wife has been staying with her brother who lives in their same neighborhood since pt was discharged from the hospital. Wife is working remotely at this time. Married x 29 years and reports current marital strain. Plays guitar and enjoys listening to music. Has started reading again. Enjoys working on cars. 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 Reports very limited psychosocial supports and that wife talked with one of his closest friends who has since distanced from him.  Past Psychiatric Medication Trials: None  Visit Diagnosis:    ICD-10-CM   1. Bipolar affective disorder, current episode mixed, current episode severity unspecified ([place])  F31.60 divalproex (DEPAKOTE ER) 500 MG 24 hr tablet    Past Psychiatric History: Denies any psychiatric tx prior to hospitalization in March.   Past Medical History:  Past Medical History:  Diagnosis Date  . Allergic rhinitis   . Dupuytren's disease     Past Surgical History:  Procedure Laterality Date  . COLONOSCOPY WITH PROPOFOL N/A 01/12/2019   Procedure: COLONOSCOPY WITH PROPOFOL;  Surgeon: Virgel Manifold, MD;  Location: [facility] ENDOSCOPY;  Service: Endoscopy;  Laterality: N/A;  . ETHMOIDECTOMY    . KNEE ARTHROSCOPY      Family Psychiatric History:  Denies  Family History:  Family History  Problem Relation Age of Onset  . Asthma Father   . Melanoma Father   . Cancer Maternal Grandmother   . Heart disease Paternal Grandfather   . ADD / ADHD Nephew     Social History:  Social History   Socioeconomic History  . Marital status: Married    Spouse name: Not on file  . Number of children: 2  . Years of education: 74  . Highest education level: Not on file  Occupational History  . Occupation: Facilities manager  Social Needs  . Financial resource strain: Not on file  . Food insecurity    Worry: Not on file    Inability: Not on file  . Transportation needs    Medical: Not on file    Non-medical: Not on file  Tobacco Use  . Smoking status: Never Smoker  . Smokeless tobacco: Never Used  Substance and Sexual Activity  . Alcohol use: Yes    Alcohol/week: 6.0 standard drinks    Types: 6 Cans of beer per week    Comment: occasional  . Drug use: No  . Sexual activity: Yes    Partners: Female  Lifestyle  . Physical activity    Days per week: Not on file    Minutes per session: Not on file  . Stress: Not on file  Relationships  . Social connections  Talks on phone: Not on file    Gets together: Not on file    Attends religious service: Not on file    Active member of club or organization: Not on file    Attends meetings of clubs or organizations: Not on file    Relationship status: Not on file  Other Topics Concern  . Not on file  Social History Narrative  . Not on file    Allergies:  Allergies  Allergen Reactions  . Oak Bark  [Quercus Robur] Other (See Comments) and Shortness Of Breath    Metabolic Disorder Labs: Lab Results  Component Value Date   HGBA1C 5.9 (H) 01/20/2019   MPG 122.63 01/20/2019   No results found for: PROLACTIN Lab Results  Component Value Date   CHOL 219 (H) 12/27/2018   TRIG 87 12/27/2018   HDL 59 12/27/2018   CHOLHDL 3.7 12/27/2018   LDLCALC 143 (H) 12/27/2018   Lab  Results  Component Value Date   TSH 1.749 01/20/2019    Therapeutic Level Labs: No results found for: LITHIUM No results found for: VALPROATE No components found for:  CBMZ  Current Medications: Current Outpatient Medications  Medication Sig Dispense Refill  . fexofenadine (ALLEGRA) 180 MG tablet Take 180 mg by mouth daily.    . fluticasone (FLONASE) 50 MCG/ACT nasal spray Place into both nostrils daily.    Marland Kitchen albuterol (PROVENTIL HFA;VENTOLIN HFA) 108 (90 Base) MCG/ACT inhaler Inhale 2 puffs into the lungs every 6 (six) hours as needed for wheezing or shortness of breath. 1 Inhaler 2  . divalproex (DEPAKOTE ER) 500 MG 24 hr tablet Take 1 tablet (500 mg total) by mouth at bedtime for 30 days. 30 tablet 0   No current facility-administered medications for this visit.     Medication Side Effects: n/a  Orders placed this visit:  No orders of the defined types were placed in this encounter.   Psychiatric Specialty Exam:  Review of Systems  Constitutional: Positive for weight loss.  Respiratory: Positive for cough and sputum production.   Skin: Positive for itching.  Neurological: Positive for tingling.  Endo/Heme/Allergies: Positive for environmental allergies and polydipsia.  Psychiatric/Behavioral: The patient has insomnia.     Blood pressure (!) 182/97, pulse 72, height 5\' 10"  (1.778 m), weight 165 lb (74.8 kg).Body mass index is 23.68 kg/m.  General Appearance: Casual, Meticulous and Neat  Eye Contact:  Good  Speech:  Clear and Coherent, Talkative and Increased rate  Volume:  Normal  Mood:  Elevated  Affect:  Appropriate  Thought Process:  Goal Directed and Linear  Orientation:  Full (Time, Place, and Person)  Thought Content: Logical   Suicidal Thoughts:  No  Homicidal Thoughts:  No  Memory:  WNL  Judgement:  Good  Insight:  Good  Psychomotor Activity:  Normal  Concentration:  Concentration: Good  Recall:  Good  Fund of Knowledge: Good  Language: Good  Assets:   Communication Skills Desire for Improvement Resilience  ADL's:  Intact  Cognition: WNL  Prognosis:  Good   Screenings:  AUDIT     Admission (Discharged) from 01/21/2019 in [facility]  Alcohol Use Disorder Identification Test Final Score (AUDIT)  4    PHQ2-9     Office Visit from 06/28/2017 in [facility]  PHQ-2 Total Score  0      Receiving Psychotherapy: No   Treatment Plan/Recommendations: Pt seen for 60 minutes and greater than 50% of session spent counseling pt re: diagnosis  and providing education re: different mood states and associated s/s. Provided pt with overview of tx options for Bipolar d/o and discussed different risks and benefits. Discussed potential benefits, risks, and side effects of Depakote ER and pt agrees to trial of Depakote 500 mg po QHS for mood stabilization. Discussed that dose would likely need to be further titrated for remission of s/s. Also discussed potential benefits, risks, and side effects of Lithium and provided pt with article about the use of Lithium. Discussed potential benefits of therapy. Pt reports that he is open to starting therapy and would like to consider schedule therapy apt at next visit. Pt to f/u with this provider in 2-3 weeks or sooner if clinically indicated. Patient advised to contact office with any questions, adverse effects, or acute worsening in signs and symptoms.     Thayer Headings, PMHNP

## 2019-05-09 ENCOUNTER — Ambulatory Visit: Payer: 59 | Admitting: Psychiatry

## 2019-05-09 ENCOUNTER — Other Ambulatory Visit: Payer: Self-pay | Admitting: Psychiatry

## 2019-05-09 ENCOUNTER — Encounter: Payer: Self-pay | Admitting: Psychiatry

## 2019-05-09 ENCOUNTER — Other Ambulatory Visit: Payer: Self-pay

## 2019-05-09 DIAGNOSIS — F3160 Bipolar disorder, current episode mixed, unspecified: Secondary | ICD-10-CM | POA: Diagnosis not present

## 2019-05-09 DIAGNOSIS — Z79899 Other long term (current) drug therapy: Secondary | ICD-10-CM

## 2019-05-09 MED ORDER — DIVALPROEX SODIUM ER 500 MG PO TB24: 1000.0000 mg | ORAL_TABLET | Freq: Every day | ORAL | 1 refills | Status: DC

## 2019-05-09 NOTE — Progress Notes (Signed)
PRATT BRESS [id number] Jul 14, 1961 58 y.o.  Subjective:   Patient ID:  Derrick Romero is a 58 y.o. (DOB 03/18/1961) male.  Chief Complaint:  Chief Complaint  Patient presents with  . Insomnia  . Other    Frustration    HPI Derrick Romero presents to the office today for follow-up of mood d/o. He reports that he has been tolerating Depakote ER without any adverse effects. Reports that he did not start it immediately since he was traveling and started it about 2 weeks. He reports that he continues to have difficulty with sleep and averaging 4-5 hours a night with a few nights of improved sleep. Describes mood as "even keeled... may be more consistent." Denies depressed mood. Denies irritability. Reports some "frustration" when there are several triggers. Denies elevated mood. Denies anxiety. He reports energy and motivation have been about the same. Denies significant change in appetite. Reports that weight has remained stable. He reports adequate concentration. He reports trying to focus on fewer things. Has been searching for jobs. Has been studying for a Arts development officer. Continued interest in usual activities. Denies racing thoughts. Denies SI.   Denies any recent impulsive or risky behavior.   Reports that he has had a series of car related issues with both his vehicle, his wife's car, and son's vehicle. Son started a job in [place] and resigned after 2 days since job was not as expected. Has been helping son with relocation. Wife continues to live with her brother. Reports that they have had some recent interactions.   Past Psychiatric Medication Trials: Depakote ER  Review of Systems:  Review of Systems  Gastrointestinal: Negative.   Musculoskeletal: Negative for gait problem.       Had f/u for hand issues yesterday. Knee pain.   Neurological: Negative for tremors.  Psychiatric/Behavioral:       Please refer to HPI    Medications: I have reviewed the patient's  current medications.  Current Outpatient Medications  Medication Sig Dispense Refill  . albuterol (PROVENTIL HFA;VENTOLIN HFA) 108 (90 Base) MCG/ACT inhaler Inhale 2 puffs into the lungs every 6 (six) hours as needed for wheezing or shortness of breath. 1 Inhaler 2  . divalproex (DEPAKOTE ER) 500 MG 24 hr tablet Take 2 tablets (1,000 mg total) by mouth at bedtime. 60 tablet 1  . fexofenadine (ALLEGRA) 180 MG tablet Take 180 mg by mouth daily.    . fluticasone (FLONASE) 50 MCG/ACT nasal spray Place into both nostrils daily.     No current facility-administered medications for this visit.     Medication Side Effects: None  Allergies:  Allergies  Allergen Reactions  . Oak Bark  [Quercus Robur] Other (See Comments) and Shortness Of Breath    Past Medical History:  Diagnosis Date  . Allergic rhinitis   . Dupuytren's disease     Family History  Problem Relation Age of Onset  . Asthma Father   . Melanoma Father   . Cancer Maternal Grandmother   . Heart disease Paternal Grandfather   . ADD / ADHD Nephew     Social History   Socioeconomic History  . Marital status: Married    Spouse name: Not on file  . Number of children: 2  . Years of education: 53  . Highest education level: Not on file  Occupational History  . Occupation: Facilities manager  Social Needs  . Financial resource strain: Not on file  . Food insecurity    Worry:  Not on file    Inability: Not on file  . Transportation needs    Medical: Not on file    Non-medical: Not on file  Tobacco Use  . Smoking status: Never Smoker  . Smokeless tobacco: Never Used  Substance and Sexual Activity  . Alcohol use: Yes    Alcohol/week: 6.0 standard drinks    Types: 6 Cans of beer per week    Comment: occasional  . Drug use: No  . Sexual activity: Yes    Partners: Female  Lifestyle  . Physical activity    Days per week: Not on file    Minutes per session: Not on file  . Stress: Not on file  Relationships  .  Social Herbalist on phone: Not on file    Gets together: Not on file    Attends religious service: Not on file    Active member of club or organization: Not on file    Attends meetings of clubs or organizations: Not on file    Relationship status: Not on file  . Intimate partner violence    Fear of current or ex partner: Not on file    Emotionally abused: Not on file    Physically abused: Not on file    Forced sexual activity: Not on file  Other Topics Concern  . Not on file  Social History Narrative  . Not on file    Past Medical History, Surgical history, Social history, and Family history were reviewed and updated as appropriate.   Please see review of systems for further details on the patient's review from today.   Objective:   Physical Exam:  There were no vitals taken for this visit.  Physical Exam Constitutional:      General: He is not in acute distress.    Appearance: He is well-developed.  Musculoskeletal:        General: No deformity.  Neurological:     Mental Status: He is alert and oriented to person, place, and time.     Coordination: Coordination normal.  Psychiatric:        Attention and Perception: Attention and perception normal. He does not perceive auditory or visual hallucinations.        Mood and Affect: Mood normal. Mood is not anxious or depressed. Affect is not labile, blunt, angry or inappropriate.        Speech: Speech normal.        Behavior: Behavior normal.        Thought Content: Thought content normal. Thought content does not include homicidal or suicidal ideation. Thought content does not include homicidal or suicidal plan.        Cognition and Memory: Cognition and memory normal.        Judgment: Judgment normal.     Comments: Insight intact. No delusions.      Lab Review:     Component Value Date/Time   NA 137 01/20/2019 2237   NA 138 12/27/2018 0821   K 3.7 01/20/2019 2237   CL 104 01/20/2019 2237   CO2 20 (L)  01/20/2019 2237   GLUCOSE 108 (H) 01/20/2019 2237   BUN 17 01/20/2019 2237   BUN 17 12/27/2018 0821   CREATININE 0.94 01/20/2019 2237   CALCIUM 8.9 01/20/2019 2237   PROT 7.6 01/20/2019 2237   PROT 7.4 12/27/2018 0821   ALBUMIN 4.3 01/20/2019 2237   ALBUMIN 4.5 12/27/2018 0821   AST 50 (H) 01/20/2019 2237   ALT  33 01/20/2019 2237   ALKPHOS 49 01/20/2019 2237   BILITOT 0.3 01/20/2019 2237   BILITOT 0.6 12/27/2018 0821   GFRNONAA >60 01/20/2019 2237   GFRAA >60 01/20/2019 2237       Component Value Date/Time   WBC 9.1 01/20/2019 2237   RBC 4.54 01/20/2019 2237   HGB 13.8 01/20/2019 2237   HGB 15.1 02/01/2018 1048   HCT 41.9 01/20/2019 2237   HCT 44.3 02/01/2018 1048   PLT 342 01/20/2019 2237   PLT 263 02/01/2018 1048   MCV 92.3 01/20/2019 2237   MCV 89 02/01/2018 1048   MCH 30.4 01/20/2019 2237   MCHC 32.9 01/20/2019 2237   RDW 12.8 01/20/2019 2237   RDW 13.5 02/01/2018 1048   LYMPHSABS 2.5 02/01/2018 1048   EOSABS 0.3 02/01/2018 1048   BASOSABS 0.0 02/01/2018 1048    No results found for: POCLITH, LITHIUM   No results found for: PHENYTOIN, PHENOBARB, VALPROATE, CBMZ   .res Assessment: Plan:   Will increase Depakote ER to 1000 mg po QHS since pt has had a partial response to 500 mg po QHS and continues to have insomnia and some mild hypomanic s/s. Will order VPA, CBC, and CMP to be obtained in one week to determine drug level and to obtain baseline for ongoing monitoring of adverse effects with Depakote ER.  Pt to f/u in 4 weeks or sooner if clinically indicated.  Patient advised to contact office with any questions, adverse effects, or acute worsening in signs and symptoms.  Duel was seen today for insomnia and other.  Diagnoses and all orders for this visit:  High risk medication use -     Valproic acid level -     CBC with Differential/Platelet -     Comprehensive metabolic panel  Bipolar affective disorder, current episode mixed, current episode severity  unspecified ([facility]) -     divalproex (DEPAKOTE ER) 500 MG 24 hr tablet; Take 2 tablets (1,000 mg total) by mouth at bedtime.     Please see After Visit Summary for patient specific instructions.  Future Appointments  Date Time Provider [place]  06/08/2019  8:30 AM Thayer Headings, PMHNP [facility] None    Orders Placed This Encounter  Procedures  . Valproic acid level  . CBC with Differential/Platelet  . Comprehensive metabolic panel    -------------------------------

## 2019-06-04 LAB — COMPREHENSIVE METABOLIC PANEL
ALT: 17 IU/L (ref 0–44)
AST: 15 IU/L (ref 0–40)
Albumin/Globulin Ratio: 1.4 (ref 1.2–2.2)
Albumin: 4.1 g/dL (ref 3.8–4.9)
Alkaline Phosphatase: 44 IU/L (ref 39–117)
BUN/Creatinine Ratio: 21 — ABNORMAL HIGH (ref 9–20)
BUN: 20 mg/dL (ref 6–24)
Bilirubin Total: 0.2 mg/dL (ref 0.0–1.2)
CO2: 25 mmol/L (ref 20–29)
Calcium: 9.3 mg/dL (ref 8.7–10.2)
Chloride: 101 mmol/L (ref 96–106)
Creatinine, Ser: 0.94 mg/dL (ref 0.76–1.27)
GFR calc Af Amer: 105 mL/min/{1.73_m2} (ref >59)
GFR calc non Af Amer: 91 mL/min/{1.73_m2} (ref >59)
Globulin, Total: 2.9 g/dL (ref 1.5–4.5)
Glucose: 85 mg/dL (ref 65–99)
Potassium: 4.7 mmol/L (ref 3.5–5.2)
Sodium: 140 mmol/L (ref 134–144)
Total Protein: 7 g/dL (ref 6.0–8.5)

## 2019-06-04 LAB — CBC WITH DIFFERENTIAL/PLATELET
Basophils Absolute: 0.1 10*3/uL (ref 0.0–0.2)
Basos: 1 %
EOS (ABSOLUTE): 0.2 10*3/uL (ref 0.0–0.4)
Eos: 2 %
Hematocrit: 41 % (ref 37.5–51.0)
Hemoglobin: 14 g/dL (ref 13.0–17.7)
Immature Grans (Abs): 0 10*3/uL (ref 0.0–0.1)
Immature Granulocytes: 0 %
Lymphocytes Absolute: 2.5 10*3/uL (ref 0.7–3.1)
Lymphs: 28 %
MCH: 30.2 pg (ref 26.6–33.0)
MCHC: 34.1 g/dL (ref 31.5–35.7)
MCV: 89 fL (ref 79–97)
Monocytes Absolute: 0.9 10*3/uL (ref 0.1–0.9)
Monocytes: 11 %
Neutrophils Absolute: 5.3 10*3/uL (ref 1.4–7.0)
Neutrophils: 58 %
Platelets: 266 10*3/uL (ref 150–450)
RBC: 4.63 x10E6/uL (ref 4.14–5.80)
RDW: 12.4 % (ref 11.6–15.4)
WBC: 9 10*3/uL (ref 3.4–10.8)

## 2019-06-04 LAB — VALPROIC ACID LEVEL: Valproic Acid Lvl: 34 ug/mL — ABNORMAL LOW (ref 50–100)

## 2019-06-08 ENCOUNTER — Other Ambulatory Visit: Payer: Self-pay

## 2019-06-08 ENCOUNTER — Encounter: Payer: Self-pay | Admitting: Psychiatry

## 2019-06-08 ENCOUNTER — Ambulatory Visit (INDEPENDENT_AMBULATORY_CARE_PROVIDER_SITE_OTHER): Payer: 59 | Admitting: Psychiatry

## 2019-06-08 DIAGNOSIS — F3177 Bipolar disorder, in partial remission, most recent episode mixed: Secondary | ICD-10-CM

## 2019-06-08 MED ORDER — DIVALPROEX SODIUM ER 500 MG PO TB24: ORAL_TABLET | ORAL | 0 refills | Status: DC

## 2019-06-08 NOTE — Progress Notes (Signed)
Derrick Romero [id number] 02/13/1961 58 y.o.  Subjective:   Patient ID:  Derrick Romero is a 58 y.o. (DOB [birth year]) male.  Chief Complaint:  Chief Complaint  Patient presents with  . Follow-up    h/o mood instability, sleeplessness    HPI Derrick Romero presents to the office today for follow-up of mood. He reports mood has been stable and feels "pretty even keel" and has noticed that he is no longer as irritated by things that normally would have caused some frustration. Denies any irritability. Denies depressed mood. Denies anxiety. He reports that he continues to feel tired. Continues to wake up at 3 am and thinking about what he needs to do and is unable to return to sleep. Going to bed between 9-10 pm. Reports that energy is ok in the morning and then is low by mid to late afternoon. Motivation has been good and denies excessive goal-directed activity. He reports that his weight has been stable. Reports appetite is higher at times and that it may be related to increased physical activity. Walking 4 miles some days. He reports adequate concentration and focus. Denies anhedonia. Has been practicing guitar again. Denies any impulsive or risky behavior. Denies SI.   Reports that he and his wife have had some "cordial interactions" with his wife. Daughter recently came to visit. Continues to search for jobs and has been applying.   Review of Systems:  Review of Systems  Gastrointestinal: Negative.   Musculoskeletal: Negative for gait problem.       Had cortisone injection to knee. Started Meloxicam for knee and hip pain.  Allergic/Immunologic: Positive for environmental allergies.  Neurological: Negative for tremors.  Psychiatric/Behavioral:       Please refer to HPI   He reports that he had employee health screening and had improved cholesterol.   Medications: I have reviewed the patient's current medications.  Current Outpatient Medications  Medication Sig Dispense Refill  .  albuterol (PROVENTIL HFA;VENTOLIN HFA) 108 (90 Base) MCG/ACT inhaler Inhale 2 puffs into the lungs every 6 (six) hours as needed for wheezing or shortness of breath. 1 Inhaler 2  . divalproex (DEPAKOTE ER) 500 MG 24 hr tablet TAKE 2 TABLETS(1000 MG) BY MOUTH AT BEDTIME 180 tablet 0  . fexofenadine (ALLEGRA) 180 MG tablet Take 180 mg by mouth daily as needed.     . fluticasone (FLONASE) 50 MCG/ACT nasal spray Place into both nostrils daily.    . meloxicam (MOBIC) 15 MG tablet TK 1 T PO QD     No current facility-administered medications for this visit.     Medication Side Effects: Other: Some decrease in libido  Allergies:  Allergies  Allergen Reactions  . Oak Bark  [Quercus Robur] Other (See Comments) and Shortness Of Breath    Past Medical History:  Diagnosis Date  . Allergic rhinitis   . Dupuytren's disease     Family History  Problem Relation Age of Onset  . Asthma Father   . Melanoma Father   . Cancer Maternal Grandmother   . Heart disease Paternal Grandfather   . ADD / ADHD Nephew     Social History   Socioeconomic History  . Marital status: Married    Spouse name: Not on file  . Number of children: 2  . Years of education: 26  . Highest education level: Not on file  Occupational History  . Occupation: Facilities manager  Social Needs  . Financial resource strain: Not on file  .  Food insecurity    Worry: Not on file    Inability: Not on file  . Transportation needs    Medical: Not on file    Non-medical: Not on file  Tobacco Use  . Smoking status: Never Smoker  . Smokeless tobacco: Never Used  Substance and Sexual Activity  . Alcohol use: Yes    Alcohol/week: 6.0 standard drinks    Types: 6 Cans of beer per week    Comment: occasional  . Drug use: No  . Sexual activity: Yes    Partners: Female  Lifestyle  . Physical activity    Days per week: Not on file    Minutes per session: Not on file  . Stress: Not on file  Relationships  . Social  Herbalist on phone: Not on file    Gets together: Not on file    Attends religious service: Not on file    Active member of club or organization: Not on file    Attends meetings of clubs or organizations: Not on file    Relationship status: Not on file  . Intimate partner violence    Fear of current or ex partner: Not on file    Emotionally abused: Not on file    Physically abused: Not on file    Forced sexual activity: Not on file  Other Topics Concern  . Not on file  Social History Narrative  . Not on file    Past Medical History, Surgical history, Social history, and Family history were reviewed and updated as appropriate.   Please see review of systems for further details on the patient's review from today.   Objective:   Physical Exam:  BP 121/80   Pulse (!) 56   Physical Exam Constitutional:      General: He is not in acute distress.    Appearance: He is well-developed.  Musculoskeletal:        General: No deformity.  Neurological:     Mental Status: He is alert and oriented to person, place, and time.     Coordination: Coordination normal.  Psychiatric:        Attention and Perception: Attention and perception normal. He does not perceive auditory or visual hallucinations.        Mood and Affect: Mood normal. Mood is not anxious or depressed. Affect is not labile, blunt, angry or inappropriate.        Speech: Speech normal.        Behavior: Behavior normal.        Thought Content: Thought content normal. Thought content does not include homicidal or suicidal ideation. Thought content does not include homicidal or suicidal plan.        Cognition and Memory: Cognition and memory normal.        Judgment: Judgment normal.     Comments: Insight intact. No delusions.      Lab Review:     Component Value Date/Time   NA 140 06/02/2019 1521   K 4.7 06/02/2019 1521   CL 101 06/02/2019 1521   CO2 25 06/02/2019 1521   GLUCOSE 85 06/02/2019 1521    GLUCOSE 108 (H) 01/20/2019 2237   BUN 20 06/02/2019 1521   CREATININE 0.94 06/02/2019 1521   CALCIUM 9.3 06/02/2019 1521   PROT 7.0 06/02/2019 1521   ALBUMIN 4.1 06/02/2019 1521   AST 15 06/02/2019 1521   ALT 17 06/02/2019 1521   ALKPHOS 44 06/02/2019 1521   BILITOT 0.2 06/02/2019  [place] 06/02/2019 1521   GFRAA 105 06/02/2019 1521       Component Value Date/Time   WBC 9.0 06/02/2019 1521   WBC 9.1 01/20/2019 2237   RBC 4.63 06/02/2019 1521   RBC 4.54 01/20/2019 2237   HGB 14.0 06/02/2019 1521   HCT 41.0 06/02/2019 1521   PLT 266 06/02/2019 1521   MCV 89 06/02/2019 1521   MCH 30.2 06/02/2019 1521   MCH 30.4 01/20/2019 2237   MCHC 34.1 06/02/2019 1521   MCHC 32.9 01/20/2019 2237   RDW 12.4 06/02/2019 1521   LYMPHSABS 2.5 06/02/2019 1521   EOSABS 0.2 06/02/2019 1521   BASOSABS 0.1 06/02/2019 1521    No results found for: POCLITH, LITHIUM   Lab Results  Component Value Date   VALPROATE 34 (L) 06/02/2019  Reports that labs were drawn in the late afternoon after taking Depakote the night before.    .res Assessment: Plan:   Discussed treatment plan and patient reports that overall mood signs and symptoms are well controlled and he would like to continue Depakote ER as prescribed.  He reports that his only concern is ideally he would like to be able to sleep for an hour longer.  Discussed sleep hygiene strategies to possibly prolong sleep to include considering playing white noise upon awakening, redirecting thoughts away from tasks that need to be completed, and considering meditation or relaxation strategies.  Reviewed recent lab results with patient. Continue Depakote ER 500 mg 2 tabs p.o. nightly for mood stabilization. Patient to follow-up in 3 months or sooner if clinically indicated. Patient advised to contact office with any questions, adverse effects, or acute worsening in signs and symptoms.  Malosi was seen today for follow-up.  Diagnoses and all orders  for this visit:  Bipolar affective disorder, current episode mixed, current episode severity unspecified (HCC) -     divalproex (DEPAKOTE ER) 500 MG 24 hr tablet; TAKE 2 TABLETS(1000 MG) BY MOUTH AT BEDTIME     Please see After Visit Summary for patient specific instructions.  Future Appointments  Date Time Provider [place]  09/06/2019  8:30 AM Thayer Headings, PMHNP [facility] None    No orders of the defined types were placed in this encounter.   -------------------------------

## 2019-09-06 ENCOUNTER — Encounter: Payer: Self-pay | Admitting: Psychiatry

## 2019-09-06 ENCOUNTER — Ambulatory Visit (INDEPENDENT_AMBULATORY_CARE_PROVIDER_SITE_OTHER): Payer: 59 | Admitting: Psychiatry

## 2019-09-06 ENCOUNTER — Other Ambulatory Visit: Payer: Self-pay

## 2019-09-06 DIAGNOSIS — F3177 Bipolar disorder, in partial remission, most recent episode mixed: Secondary | ICD-10-CM

## 2019-09-06 MED ORDER — DIVALPROEX SODIUM ER 500 MG PO TB24: ORAL_TABLET | ORAL | 0 refills | Status: DC

## 2019-09-06 NOTE — Progress Notes (Signed)
CHAPEL ZELLAR [id number] May 21, 1961 57 y.o.  Subjective:   Patient ID:  Derrick Romero is a 58 y.o. (DOB Jan 14, 1961) male.  Chief Complaint:  Chief Complaint  Patient presents with  . Follow-up    h/o mood disturbance    HPI Derrick Romero presents to the office today for follow-up of mood and and anxiety. Descrtibes mood as "pretty good. Mellow."He has noticed some decrease in energy and motivation with shorter days and colder tempteratures. Denies depressed mood. Denies elevated or irritable moods. Denies anxiety. He reports that his sleep was good for a longre period of time and has not been as good over the last 3 weeks. Now sleeping about 6 hours a night and was previously sleeping about 7.5 hours a night. Appetite has been good and has gained about 10 lbs in last 2 months. Reports wt gain may also be related to decreased exercise. Denies impuslive or risky behavior. Denies SI.  Continues to search for jobs. His son has returned home and is living with pt. Wife is living with her brother that lives a few streets over. Reports that they all get together for dinner weekly.  Review of Systems:  Review of Systems  HENT: Positive for congestion.   Gastrointestinal: Negative.   Musculoskeletal: Negative for gait problem.       Had injections for his hand's. Reports that he can type and play guitar again. Had some knee and hip pain that was improved with PT.  Allergic/Immunologic: Positive for environmental allergies.  Neurological: Negative for tremors.  Psychiatric/Behavioral:       Please refer to HPI    Medications: I have reviewed the patient's current medications.  Current Outpatient Medications  Medication Sig Dispense Refill  . divalproex (DEPAKOTE ER) 500 MG 24 hr tablet TAKE 2 TABLETS(1000 MG) BY MOUTH AT BEDTIME 180 tablet 0  . fexofenadine (ALLEGRA) 180 MG tablet Take 180 mg by mouth daily as needed.     . fluticasone (FLONASE) 50 MCG/ACT nasal spray Place into both  nostrils daily.    . meloxicam (MOBIC) 15 MG tablet TK 1 T PO QD    . albuterol (PROVENTIL HFA;VENTOLIN HFA) 108 (90 Base) MCG/ACT inhaler Inhale 2 puffs into the lungs every 6 (six) hours as needed for wheezing or shortness of breath. 1 Inhaler 2   No current facility-administered medications for this visit.     Medication Side Effects: None  Allergies:  Allergies  Allergen Reactions  . Oak Bark  [Quercus Robur] Other (See Comments) and Shortness Of Breath    Past Medical History:  Diagnosis Date  . Allergic rhinitis   . Dupuytren's disease     Family History  Problem Relation Age of Onset  . Asthma Father   . Melanoma Father   . Cancer Maternal Grandmother   . Heart disease Paternal Grandfather   . ADD / ADHD Nephew     Social History   Socioeconomic History  . Marital status: Married    Spouse name: Not on file  . Number of children: 2  . Years of education: 79  . Highest education level: Not on file  Occupational History  . Occupation: Facilities manager  Social Needs  . Financial resource strain: Not on file  . Food insecurity    Worry: Not on file    Inability: Not on file  . Transportation needs    Medical: Not on file    Non-medical: Not on file  Tobacco Use  .  Smoking status: Never Smoker  . Smokeless tobacco: Never Used  Substance and Sexual Activity  . Alcohol use: Yes    Alcohol/week: 6.0 standard drinks    Types: 6 Cans of beer per week    Comment: occasional  . Drug use: No  . Sexual activity: Yes    Partners: Female  Lifestyle  . Physical activity    Days per week: Not on file    Minutes per session: Not on file  . Stress: Not on file  Relationships  . Social Herbalist on phone: Not on file    Gets together: Not on file    Attends religious service: Not on file    Active member of club or organization: Not on file    Attends meetings of clubs or organizations: Not on file    Relationship status: Not on file  . Intimate  partner violence    Fear of current or ex partner: Not on file    Emotionally abused: Not on file    Physically abused: Not on file    Forced sexual activity: Not on file  Other Topics Concern  . Not on file  Social History Narrative  . Not on file    Past Medical History, Surgical history, Social history, and Family history were reviewed and updated as appropriate.   Please see review of systems for further details on the patient's review from today.   Objective:   Physical Exam:  Wt 182 lb (82.6 kg)   BMI 26.11 kg/m   Physical Exam Constitutional:      General: He is not in acute distress.    Appearance: He is well-developed.  Musculoskeletal:        General: No deformity.  Neurological:     Mental Status: He is alert and oriented to person, place, and time.     Coordination: Coordination normal.  Psychiatric:        Attention and Perception: Attention and perception normal. He does not perceive auditory or visual hallucinations.        Mood and Affect: Mood normal. Mood is not anxious or depressed. Affect is not labile, blunt, angry or inappropriate.        Speech: Speech normal.        Behavior: Behavior normal.        Thought Content: Thought content normal. Thought content does not include homicidal or suicidal ideation. Thought content does not include homicidal or suicidal plan.        Cognition and Memory: Cognition and memory normal.        Judgment: Judgment normal.     Comments: Insight intact. No delusions.      Lab Review:     Component Value Date/Time   NA 140 06/02/2019 1521   K 4.7 06/02/2019 1521   CL 101 06/02/2019 1521   CO2 25 06/02/2019 1521   GLUCOSE 85 06/02/2019 1521   GLUCOSE 108 (H) 01/20/2019 2237   BUN 20 06/02/2019 1521   CREATININE 0.94 06/02/2019 1521   CALCIUM 9.3 06/02/2019 1521   PROT 7.0 06/02/2019 1521   ALBUMIN 4.1 06/02/2019 1521   AST 15 06/02/2019 1521   ALT 17 06/02/2019 1521   ALKPHOS 44 06/02/2019 1521   BILITOT  0.2 06/02/2019 1521   GFRNONAA 91 06/02/2019 1521   GFRAA 105 06/02/2019 1521       Component Value Date/Time   WBC 9.0 06/02/2019 1521   WBC 9.1 01/20/2019 2237  RBC 4.63 06/02/2019 1521   RBC 4.54 01/20/2019 2237   HGB 14.0 06/02/2019 1521   HCT 41.0 06/02/2019 1521   PLT 266 06/02/2019 1521   MCV 89 06/02/2019 1521   MCH 30.2 06/02/2019 1521   MCH 30.4 01/20/2019 2237   MCHC 34.1 06/02/2019 1521   MCHC 32.9 01/20/2019 2237   RDW 12.4 06/02/2019 1521   LYMPHSABS 2.5 06/02/2019 1521   EOSABS 0.2 06/02/2019 1521   BASOSABS 0.1 06/02/2019 1521    No results found for: POCLITH, LITHIUM   Lab Results  Component Value Date   VALPROATE 34 (L) 06/02/2019     .res Assessment: Plan:   Discussed considering addition of Vitamin D 1,000 IU po qd to improve mild seasonal depressive s/s to include low energy since some studies indicate that Vitamin D taken in the fall and winter can help alleviate s/s of seasonal depression.  Will continue Depakote 1000 mg po QHS for mood stabilization since pt reports that overall his mood and sleep remain improved with Depakote.  Pt to f/u in 6 weeks or sooner if clinically indicated.  Patient advised to contact office with any questions, adverse effects, or acute worsening in signs and symptoms.  Lizbeth was seen today for follow-up.  Diagnoses and all orders for this visit:  Bipolar disorder, in partial remission, most recent episode mixed (HCC) -     divalproex (DEPAKOTE ER) 500 MG 24 hr tablet; TAKE 2 TABLETS(1000 MG) BY MOUTH AT BEDTIME     Please see After Visit Summary for patient specific instructions.  Future Appointments  Date Time Provider [place]  10/18/2019  8:30 AM Thayer Headings, PMHNP [facility] None    No orders of the defined types were placed in this encounter.   -------------------------------

## 2019-10-06 ENCOUNTER — Ambulatory Visit: Payer: Self-pay | Admitting: *Deleted

## 2019-10-06 ENCOUNTER — Other Ambulatory Visit: Payer: Self-pay

## 2019-10-06 DIAGNOSIS — U071 COVID-19: Secondary | ICD-10-CM

## 2019-10-06 NOTE — Telephone Encounter (Signed)
Please advise 

## 2019-10-06 NOTE — Telephone Encounter (Signed)
Agree with triage to send him to ED for SOB.

## 2019-10-06 NOTE — Telephone Encounter (Signed)
Pt called with complaints of SOB due to coughing for 2 days but on 10/06/2019 at 0330; he says that it woke him up; he has been having cold/flu symptoms for 2 weeks; the pt thought his symptoms were allergy related; he has been taking Equate cold medication; he also used his albuterol inhaler several times yesterday without relief; the pt says that he is difficult to take a deep breath in ; he also says that he is wheezing; recommendations made per nurse triage protocol; he verbalized understanding; the pt sees Dr Brita Romp, [facility]; will route to office for notification.  Reason for Disposition . [1] MODERATE difficulty breathing (e.g., speaks in phrases, SOB even at rest, pulse 100-120) AND [2] NEW-onset or WORSE than normal  Answer Assessment - Initial Assessment Questions 1. RESPIRATORY STATUS: "Describe your breathing?" (e.g., wheezing, shortness of breath, unable to speak, severe coughing)      Shortness of breath 2. ONSET: "When did this breathing problem begin?"     10/03/2019 3. PATTERN "Does the difficult breathing come and go, or has it been constant since it started?"    Intermittent but worsening 4. SEVERITY: "How bad is your breathing?" (e.g., mild, moderate, severe)    - MILD: No SOB at rest, mild SOB with walking, speaks normally in sentences, can lay down, no retractions, pulse < 100.    - MODERATE: SOB at rest, SOB with minimal exertion and prefers to sit, cannot lie down flat, speaks in phrases, mild retractions, audible wheezing, pulse 100-120.    - SEVERE: Very SOB at rest, speaks in single words, struggling to breathe, sitting hunched forward, retractions, pulse > 120      moderate 5. RECURRENT SYMPTOM: "Have you had difficulty breathing before?" If so, ask: "When was the last time?" and "What happened that time?"     no 6. CARDIAC HISTORY: "Do you have any history of heart disease?" (e.g., heart attack, angina, bypass surgery, angioplasty)     no 7. LUNG  HISTORY: "Do you have any history of lung disease?"  (e.g., pulmonary embolus, asthma, emphysema)     asrhma 8. CAUSE: "What do you think is causing the breathing problem?"   not sure 9. OTHER SYMPTOMS: "Do you have any other symptoms? (e.g., dizziness, runny nose, cough, chest pain, fever)     Worsening cough, and runny nose 10. PREGNANCY: "Is there any chance you are pregnant?" "When was your last menstrual period?"       n/a 11. TRAVEL: "Have you traveled out of the country in the last month?" (e.g., travel history, exposures)       no  Protocols used: BREATHING DIFFICULTY-A-AH

## 2019-10-06 NOTE — Telephone Encounter (Signed)
Patient advised as below.  

## 2019-10-07 LAB — NOVEL CORONAVIRUS, NAA: SARS-CoV-2, NAA: NOT DETECTED

## 2019-10-09 ENCOUNTER — Ambulatory Visit (INDEPENDENT_AMBULATORY_CARE_PROVIDER_SITE_OTHER): Payer: Managed Care, Other (non HMO) | Admitting: Family Medicine

## 2019-10-09 ENCOUNTER — Ambulatory Visit
Admission: RE | Admit: 2019-10-09 | Discharge: 2019-10-09 | Disposition: A | Discharge status: Home / Self Care | Payer: Managed Care, Other (non HMO) | Source: Ambulatory Visit | Attending: Family Medicine | Admitting: Family Medicine

## 2019-10-09 ENCOUNTER — Other Ambulatory Visit: Payer: Self-pay

## 2019-10-09 ENCOUNTER — Other Ambulatory Visit: Payer: Self-pay | Admitting: Family Medicine

## 2019-10-09 ENCOUNTER — Ambulatory Visit
Admission: RE | Admit: 2019-10-09 | Discharge: 2019-10-09 | Disposition: A | Discharge status: Home / Self Care | Payer: Managed Care, Other (non HMO) | Attending: Family Medicine | Admitting: Family Medicine

## 2019-10-09 ENCOUNTER — Telehealth: Payer: Self-pay

## 2019-10-09 DIAGNOSIS — R059 Cough, unspecified: Secondary | ICD-10-CM

## 2019-10-09 DIAGNOSIS — J209 Acute bronchitis, unspecified: Secondary | ICD-10-CM | POA: Insufficient documentation

## 2019-10-09 DIAGNOSIS — R0602 Shortness of breath: Secondary | ICD-10-CM | POA: Insufficient documentation

## 2019-10-09 DIAGNOSIS — R05 Cough: Secondary | ICD-10-CM | POA: Insufficient documentation

## 2019-10-09 DIAGNOSIS — J440 Chronic obstructive pulmonary disease with acute lower respiratory infection: Secondary | ICD-10-CM | POA: Insufficient documentation

## 2019-10-09 MED ORDER — AZITHROMYCIN 250 MG PO TABS: ORAL_TABLET | ORAL | 0 refills | Status: DC

## 2019-10-09 MED ORDER — BENZONATATE 100 MG PO CAPS: 100.0000 mg | ORAL_CAPSULE | Freq: Two times a day (BID) | ORAL | 0 refills | Status: DC | PRN

## 2019-10-09 MED ORDER — ALBUTEROL SULFATE HFA 108 (90 BASE) MCG/ACT IN AERS: 2.0000 | INHALATION_SPRAY | Freq: Four times a day (QID) | RESPIRATORY_TRACT | 1 refills | Status: DC | PRN

## 2019-10-09 MED ORDER — PREDNISONE 20 MG PO TABS: 40.0000 mg | ORAL_TABLET | Freq: Every day | ORAL | 0 refills | Status: AC

## 2019-10-09 NOTE — Telephone Encounter (Signed)
-----   Message from Virginia Crews, MD sent at 10/09/2019  3:51 PM EST ----- Chest x-ray shows no pneumonia, but does show COPD changes.  We will treat like a COPD exacerbation with antibiotic and prednisone.  Use albuterol as needed.  If this recurs, we may need to consider controller inhaler to use daily.  I will send the antibiotic and steroids to his pharmacy.

## 2019-10-09 NOTE — Telephone Encounter (Signed)
Pt advised.   Thanks,   -Laura  

## 2019-10-09 NOTE — Progress Notes (Signed)
Patient: Derrick Romero Male    DOB: 1961-04-08   58 y.o.   MRN: [id number] Visit Date: 10/09/2019  Today's Provider: Lavon Paganini, MD   Chief Complaint  Patient presents with  . Cough    Started about four weeks ago.   Subjective:    Virtual Visit via Telephone Note  I connected with [name] on 10/09/19 at  9:40 AM EST by telephone and verified that I am speaking with the correct person using two identifiers.   Patient location: home Provider location: [place] involved in the visit: patient, provider   I discussed the limitations, risks, security and privacy concerns of performing an evaluation and management service by telephone and the availability of in person appointments. I also discussed with the patient that there may be a patient responsible charge related to this service. The patient expressed understanding and agreed to proceed.    Cough This is a new problem. Episode onset: Started about four weeks ago. The problem has been gradually worsening. Associated symptoms include shortness of breath and wheezing. Pertinent negatives include no ear pain, headaches, postnasal drip, rhinorrhea or sore throat.    Feels like it has worsened over the last week.  Initially thought it was a cold or allergies Has been using OTC cold medications for ~3 weeks.  SOB worse with climbing stairs. Was walking 4 miles 3-4 times per week before this started. No SOB at rest Able to speak in full sentences.  Occasionally wakes in the middle of the night with SOB.  Has been using rescue inhaler often over the last week.   Denies fever.  No myalgias or fatigue.  COVID test 11/4 - negative.   Allergies  Allergen Reactions  . Oak Bark  [Quercus Robur] Other (See Comments) and Shortness Of Breath     Current Outpatient Medications:  .  albuterol (VENTOLIN HFA) 108 (90 Base) MCG/ACT inhaler, Inhale 2 puffs into the lungs every 6 (six) hours as  needed for wheezing or shortness of breath., Disp: 18 g, Rfl: 1 .  divalproex (DEPAKOTE ER) 500 MG 24 hr tablet, TAKE 2 TABLETS(1000 MG) BY MOUTH AT BEDTIME, Disp: 180 tablet, Rfl: 0 .  fexofenadine (ALLEGRA) 180 MG tablet, Take 180 mg by mouth daily as needed. , Disp: , Rfl:  .  fluticasone (FLONASE) 50 MCG/ACT nasal spray, Place into both nostrils daily., Disp: , Rfl:  .  meloxicam (MOBIC) 15 MG tablet, TK 1 T PO QD, Disp: , Rfl:  .  benzonatate (TESSALON) 100 MG capsule, Take 1-2 capsules (100-200 mg total) by mouth 2 (two) times daily as needed for cough., Disp: 30 capsule, Rfl: 0  Review of Systems  Constitutional: Negative.   HENT: Positive for congestion and sinus pressure. Negative for ear discharge, ear pain, hearing loss, mouth sores, nosebleeds, postnasal drip, rhinorrhea, sinus pain, sneezing, sore throat, tinnitus, trouble swallowing and voice change.   Respiratory: Positive for cough, shortness of breath and wheezing. Negative for apnea, choking, chest tightness and stridor.   Neurological: Negative for dizziness, light-headedness and headaches.    Social History   Tobacco Use  . Smoking status: Never Smoker  . Smokeless tobacco: Never Used  Substance Use Topics  . Alcohol use: Yes    Alcohol/week: 6.0 standard drinks    Types: 6 Cans of beer per week    Comment: occasional      Objective:   There were no vitals taken for this  visit. There were no vitals filed for this visit.There is no height or weight on file to calculate BMI.   Physical Exam Speaking in full sentences in no apparent distress  No results found for any visits on 10/09/19.     Assessment & Plan      I discussed the assessment and treatment plan with the patient. The patient was provided an opportunity to ask questions and all were answered. The patient agreed with the plan and demonstrated an understanding of the instructions.   The patient was advised to call back or seek an in-person  evaluation if the symptoms worsen or if the condition fails to improve as anticipated.   1. Shortness of breath 2. Cough -Patient with longstanding cough with worsening shortness of breath over the last week -Suspect viral etiology initially, possibly Covid, but test last week was negative -Now concerning for post viral pneumonia or bronchitis -We will obtain chest x-ray to evaluate this further -If he has pneumonia, we will treat for CAP with antibiotics -If he does not have pneumonia on chest x-ray, will presume bronchitis and treat with prednisone burst and taper -Can use albuterol and Tessalon as needed for symptoms -Continue OTC management as well -Discussed return precautions - DG Chest 2 View; Future    Meds ordered this encounter  Medications  . albuterol (VENTOLIN HFA) 108 (90 Base) MCG/ACT inhaler    Sig: Inhale 2 puffs into the lungs every 6 (six) hours as needed for wheezing or shortness of breath.    Dispense:  18 g    Refill:  1  . benzonatate (TESSALON) 100 MG capsule    Sig: Take 1-2 capsules (100-200 mg total) by mouth 2 (two) times daily as needed for cough.    Dispense:  30 capsule    Refill:  0     The entirety of the information documented in the History of Present Illness, Review of Systems and Physical Exam were personally obtained by me. Portions of this information were initially documented by Ashley Royalty, CMA and reviewed by me for thoroughness and accuracy.    Tiaunna Buford, Dionne Bucy, MD MPH [facility]

## 2019-10-18 ENCOUNTER — Ambulatory Visit (INDEPENDENT_AMBULATORY_CARE_PROVIDER_SITE_OTHER): Payer: 59 | Admitting: Psychiatry

## 2019-10-18 ENCOUNTER — Encounter: Payer: Self-pay | Admitting: Psychiatry

## 2019-10-18 DIAGNOSIS — F3177 Bipolar disorder, in partial remission, most recent episode mixed: Secondary | ICD-10-CM

## 2019-10-18 NOTE — Progress Notes (Signed)
Derrick Romero [id number] 1960/12/26 58 y.o.  Virtual Visit via Video Note  I connected with pt @ on 10/18/19 at  8:30 AM EST by a video enabled telemedicine application and verified that I am speaking with the correct person using two identifiers.   I discussed the limitations of evaluation and management by telemedicine and the availability of in person appointments. The patient expressed understanding and agreed to proceed.  I discussed the assessment and treatment plan with the patient. The patient was provided an opportunity to ask questions and all were answered. The patient agreed with the plan and demonstrated an understanding of the instructions.   The patient was advised to call back or seek an in-person evaluation if the symptoms worsen or if the condition fails to improve as anticipated.  I provided 15 minutes of non-face-to-face time during this encounter.  The patient was located at home.  The provider was located at [place].   Thayer Headings, PMHNP   Subjective:   Patient ID:  Derrick Romero is a 58 y.o. (DOB 13-Oct-1961) male.  Chief Complaint:  Chief Complaint  Patient presents with  . Follow-up    h/o Mood disturbance    HPI Derrick Romero presents for follow-up of Mood disturbance. He reports that he had bronchitis a couple of weeks ago. He reports that he had the most severe SOB he has experienced.  Mood has been stable. Denies sad mood or irritability. Denies anxiety. Energy is improving. Motivation is somewhat low. He was sleeping well prior to bronchitis. Sleep was disrupted when he had bronchitis. No change in appetite. He reports adequate concentration focus. Denies anhedonia. Denies SI.   Denies any significant psychosocial stressors. Has been trying to eat healthy and walk more.   Review of Systems:  Review of Systems  Respiratory: Positive for shortness of breath.   Musculoskeletal: Negative for gait problem.  Neurological: Negative  for tremors.  Psychiatric/Behavioral:       Please refer to HPI    Medications: Reviewed  Current Outpatient Medications  Medication Sig Dispense Refill  . albuterol (VENTOLIN HFA) 108 (90 Base) MCG/ACT inhaler Inhale 2 puffs into the lungs every 6 (six) hours as needed for wheezing or shortness of breath. 54 g 1  . cholecalciferol (VITAMIN D3) 25 MCG (1000 UT) tablet Take 1,000 Units by mouth daily.    . divalproex (DEPAKOTE ER) 500 MG 24 hr tablet TAKE 2 TABLETS(1000 MG) BY MOUTH AT BEDTIME 180 tablet 0  . fexofenadine (ALLEGRA) 180 MG tablet Take 180 mg by mouth daily as needed.     . fluticasone (FLONASE) 50 MCG/ACT nasal spray Place into both nostrils daily.    . Multiple Vitamin (MULTIVITAMIN) tablet Take 1 tablet by mouth daily.    . psyllium (METAMUCIL) 58.6 % packet Take 1 packet by mouth daily.    Marland Kitchen azithromycin (ZITHROMAX) 250 MG tablet Take 500mg  PO daily x1d and then 250mg  daily x4 days (Patient not taking: Reported on 10/18/2019) 6 each 0  . benzonatate (TESSALON) 100 MG capsule Take 1-2 capsules (100-200 mg total) by mouth 2 (two) times daily as needed for cough. (Patient not taking: Reported on 10/18/2019) 30 capsule 0  . meloxicam (MOBIC) 15 MG tablet TK 1 T PO QD     No current facility-administered medications for this visit.    Medication Side Effects: None  Allergies:  Allergies  Allergen Reactions  . Oak Bark  [Quercus Robur] Other (See Comments) and Shortness Of Breath  Past Medical History:  Diagnosis Date  . Allergic rhinitis   . Dupuytren's disease     Family History  Problem Relation Age of Onset  . Asthma Father   . Melanoma Father   . Cancer Maternal Grandmother   . Heart disease Paternal Grandfather   . ADD / ADHD Nephew     Social History   Socioeconomic History  . Marital status: Married    Spouse name: Not on file  . Number of children: 2  . Years of education: 49  . Highest education level: Not on file  Occupational History  .  Occupation: Facilities manager  Tobacco Use  . Smoking status: Never Smoker  . Smokeless tobacco: Never Used  Substance and Sexual Activity  . Alcohol use: Yes    Alcohol/week: 6.0 standard drinks    Types: 6 Cans of beer per week    Comment: occasional  . Drug use: No  . Sexual activity: Yes    Partners: Female  Other Topics Concern  . Not on file  Social History Narrative  . Not on file   Social Determinants of Health   Financial Resource Strain:   . Difficulty of Paying Living Expenses: Not on file  Food Insecurity:   . Worried About Charity fundraiser in the Last Year: Not on file  . Ran Out of Food in the Last Year: Not on file  Transportation Needs:   . Lack of Transportation (Medical): Not on file  . Lack of Transportation (Non-Medical): Not on file  Physical Activity:   . Days of Exercise per Week: Not on file  . Minutes of Exercise per Session: Not on file  Stress:   . Feeling of Stress : Not on file  Social Connections:   . Frequency of Communication with Friends and Family: Not on file  . Frequency of Social Gatherings with Friends and Family: Not on file  . Attends Religious Services: Not on file  . Active Member of Clubs or Organizations: Not on file  . Attends Archivist Meetings: Not on file  . Marital Status: Not on file  Intimate Partner Violence:   . Fear of Current or Ex-Partner: Not on file  . Emotionally Abused: Not on file  . Physically Abused: Not on file  . Sexually Abused: Not on file    Past Medical History, Surgical history, Social history, and Family history were reviewed and updated as appropriate.   Please see review of systems for further details on the patient's review from today.   Objective:   Physical Exam:  There were no vitals taken for this visit.  Physical Exam Neurological:     Mental Status: He is alert and oriented to person, place, and time.     Cranial Nerves: No dysarthria.  Psychiatric:         Attention and Perception: Attention and perception normal.        Mood and Affect: Mood normal.        Speech: Speech normal.        Behavior: Behavior is cooperative.        Thought Content: Thought content normal. Thought content is not paranoid or delusional. Thought content does not include homicidal or suicidal ideation. Thought content does not include homicidal or suicidal plan.        Cognition and Memory: Cognition and memory normal.        Judgment: Judgment normal.     Comments: Insight intact  Lab Review:     Component Value Date/Time   NA 140 06/02/2019 1521   K 4.7 06/02/2019 1521   CL 101 06/02/2019 1521   CO2 25 06/02/2019 1521   GLUCOSE 85 06/02/2019 1521   GLUCOSE 108 (H) 01/20/2019 2237   BUN 20 06/02/2019 1521   CREATININE 0.94 06/02/2019 1521   CALCIUM 9.3 06/02/2019 1521   PROT 7.0 06/02/2019 1521   ALBUMIN 4.1 06/02/2019 1521   AST 15 06/02/2019 1521   ALT 17 06/02/2019 1521   ALKPHOS 44 06/02/2019 1521   BILITOT 0.2 06/02/2019 1521   GFRNONAA 91 06/02/2019 1521   GFRAA 105 06/02/2019 1521       Component Value Date/Time   WBC 9.0 06/02/2019 1521   WBC 9.1 01/20/2019 2237   RBC 4.63 06/02/2019 1521   RBC 4.54 01/20/2019 2237   HGB 14.0 06/02/2019 1521   HCT 41.0 06/02/2019 1521   PLT 266 06/02/2019 1521   MCV 89 06/02/2019 1521   MCH 30.2 06/02/2019 1521   MCH 30.4 01/20/2019 2237   MCHC 34.1 06/02/2019 1521   MCHC 32.9 01/20/2019 2237   RDW 12.4 06/02/2019 1521   LYMPHSABS 2.5 06/02/2019 1521   EOSABS 0.2 06/02/2019 1521   BASOSABS 0.1 06/02/2019 1521    No results found for: POCLITH, LITHIUM   Lab Results  Component Value Date   VALPROATE 34 (L) 06/02/2019     .res Assessment: Plan:   Will continue current plan of care since target signs and symptoms are well controlled without any tolerability issues. Pt to f/u in 2 months or sooner if clinically indicated.  Patient advised to contact office with any questions, adverse  effects, or acute worsening in signs and symptoms.  Derrick Romero was seen today for follow-up.  Diagnoses and all orders for this visit:  Bipolar disorder, in partial remission, most recent episode mixed [facility])     Please see After Visit Summary for patient specific instructions.  No future appointments.  No orders of the defined types were placed in this encounter.     -------------------------------

## 2020-01-29 ENCOUNTER — Telehealth: Payer: Self-pay | Admitting: Psychiatry

## 2020-01-29 ENCOUNTER — Other Ambulatory Visit: Payer: Self-pay

## 2020-01-29 DIAGNOSIS — F3177 Bipolar disorder, in partial remission, most recent episode mixed: Secondary | ICD-10-CM

## 2020-01-29 MED ORDER — DIVALPROEX SODIUM ER 500 MG PO TB24: ORAL_TABLET | ORAL | 0 refills | Status: DC

## 2020-01-29 NOTE — Telephone Encounter (Signed)
RX for Depakote ER 500 mg 2 at hs, #180 submitted to [facility]

## 2020-01-29 NOTE — Telephone Encounter (Signed)
Pt would like a refill for his Divalproex 500mg  that has expired. Please send to [facility] in [place].

## 2020-06-07 ENCOUNTER — Other Ambulatory Visit: Payer: Self-pay

## 2020-06-07 ENCOUNTER — Telehealth: Payer: Self-pay | Admitting: Psychiatry

## 2020-06-07 DIAGNOSIS — F3177 Bipolar disorder, in partial remission, most recent episode mixed: Secondary | ICD-10-CM

## 2020-06-07 MED ORDER — DIVALPROEX SODIUM ER 500 MG PO TB24: ORAL_TABLET | ORAL | 0 refills | Status: DC

## 2020-06-07 NOTE — Telephone Encounter (Signed)
Refill sent.

## 2020-06-07 NOTE — Telephone Encounter (Signed)
Pt called to schedule apt and requested refill for Divalproex ER @ [facility] on [address]

## 2020-07-02 ENCOUNTER — Other Ambulatory Visit: Payer: Self-pay

## 2020-07-02 ENCOUNTER — Encounter: Payer: Self-pay | Admitting: Psychiatry

## 2020-07-02 ENCOUNTER — Ambulatory Visit (INDEPENDENT_AMBULATORY_CARE_PROVIDER_SITE_OTHER): Payer: 59 | Admitting: Psychiatry

## 2020-07-02 VITALS — Wt 188.0 lb

## 2020-07-02 DIAGNOSIS — Z79899 Other long term (current) drug therapy: Secondary | ICD-10-CM | POA: Diagnosis not present

## 2020-07-02 DIAGNOSIS — F3177 Bipolar disorder, in partial remission, most recent episode mixed: Secondary | ICD-10-CM

## 2020-07-02 MED ORDER — DIVALPROEX SODIUM ER 500 MG PO TB24: ORAL_TABLET | ORAL | 0 refills | Status: DC

## 2020-07-02 NOTE — Progress Notes (Signed)
Derrick Romero [id number] November 19, 1960 59 y.o.  Subjective:   Patient ID:  Derrick Romero is a 59 y.o. (DOB Oct 19, 1961) male.  Chief Complaint:  Chief Complaint  Patient presents with  . Follow-up    h/o mood disturbance    HPI Derrick Romero presents to the office today for follow-up of mood disturbance. He reports that he has noticed some weight gain and reports that he has not been hiking as much with the extreme heat. He reports that his mood has been "pretty even keel." Denies irritability or elevated moods. Denies anxiety. He reports that his energy has been low. He reports that energy may be low due to heat. Motivation is best in the morning and diminishes throughout the day. He reports that he is a light sleeper and usually sleeps through the night without difficulty. Appetite has been good. Denies snoring or gasping for air. Concentration is adequate. Denies anhedonia. Denies SI.   Bought a kayak and has been enjoying this. Enjoying reading and watching some shows. Son got an apartment and will be moving out.   Wife continues to take care of her brother. He reports that he and his wife's relationship has been going well. He has been interviewing for jobs.   PHQ2-9     Office Visit from 06/28/2017 in [facility]  PHQ-2 Total Score 0       Review of Systems:  Review of Systems  Genitourinary: Positive for frequency.  Musculoskeletal: Negative for gait problem.  Neurological: Negative for tremors.  Psychiatric/Behavioral:       Please refer to HPI    Medications: I have reviewed the patient's current medications.  Current Outpatient Medications  Medication Sig Dispense Refill  . cholecalciferol (VITAMIN D3) 25 MCG (1000 UT) tablet Take 1,000 Units by mouth daily.    . divalproex (DEPAKOTE ER) 500 MG 24 hr tablet TAKE 2 TABLETS(1000 MG) BY MOUTH AT BEDTIME 180 tablet 0  . fexofenadine (ALLEGRA) 180 MG tablet Take 180 mg by mouth daily as needed.     .  fluticasone (FLONASE) 50 MCG/ACT nasal spray Place into both nostrils daily.    . Glucosamine-Chondroitin (GLUCOSAMINE CHONDR COMPLEX PO) Take by mouth.    . Multiple Vitamin (MULTIVITAMIN) tablet Take 1 tablet by mouth daily.    . psyllium (METAMUCIL) 58.6 % packet Take 1 packet by mouth daily.    Marland Kitchen albuterol (VENTOLIN HFA) 108 (90 Base) MCG/ACT inhaler Inhale 2 puffs into the lungs every 6 (six) hours as needed for wheezing or shortness of breath. 54 g 1   No current facility-administered medications for this visit.    Medication Side Effects: Other: Possible wt gain, decreased sexual interest, increased urination at night  Allergies:  Allergies  Allergen Reactions  . Oak Bark  [Quercus Robur] Other (See Comments) and Shortness Of Breath    Past Medical History:  Diagnosis Date  . Allergic rhinitis   . Dupuytren's disease     Family History  Problem Relation Age of Onset  . Asthma Father   . Melanoma Father   . Cancer Maternal Grandmother   . Heart disease Paternal Grandfather   . ADD / ADHD Nephew     Social History   Socioeconomic History  . Marital status: Married    Spouse name: Not on file  . Number of children: 2  . Years of education: 32  . Highest education level: Not on file  Occupational History  . Occupation: Facilities manager  Tobacco Use  . Smoking status: Never Smoker  . Smokeless tobacco: Never Used  Substance and Sexual Activity  . Alcohol use: Yes    Alcohol/week: 6.0 standard drinks    Types: 6 Cans of beer per week    Comment: occasional  . Drug use: No  . Sexual activity: Yes    Partners: Female  Other Topics Concern  . Not on file  Social History Narrative  . Not on file   Social Determinants of Health   Financial Resource Strain:   . Difficulty of Paying Living Expenses: Not on file  Food Insecurity:   . Worried About Charity fundraiser in the Last Year: Not on file  . Ran Out of Food in the Last Year: Not on file   Transportation Needs:   . Lack of Transportation (Medical): Not on file  . Lack of Transportation (Non-Medical): Not on file  Physical Activity:   . Days of Exercise per Week: Not on file  . Minutes of Exercise per Session: Not on file  Stress:   . Feeling of Stress : Not on file  Social Connections:   . Frequency of Communication with Friends and Family: Not on file  . Frequency of Social Gatherings with Friends and Family: Not on file  . Attends Religious Services: Not on file  . Active Member of Clubs or Organizations: Not on file  . Attends Archivist Meetings: Not on file  . Marital Status: Not on file  Intimate Partner Violence:   . Fear of Current or Ex-Partner: Not on file  . Emotionally Abused: Not on file  . Physically Abused: Not on file  . Sexually Abused: Not on file    Past Medical History, Surgical history, Social history, and Family history were reviewed and updated as appropriate.   Please see review of systems for further details on the patient's review from today.   Objective:   Physical Exam:  Wt 188 lb (85.3 kg)   BMI 26.98 kg/m   Physical Exam Constitutional:      General: He is not in acute distress. Musculoskeletal:        General: No deformity.  Neurological:     Mental Status: He is alert and oriented to person, place, and time.     Coordination: Coordination normal.  Psychiatric:        Attention and Perception: Attention and perception normal. He does not perceive auditory or visual hallucinations.        Mood and Affect: Mood normal. Mood is not anxious or depressed. Affect is not labile, blunt, angry or inappropriate.        Speech: Speech normal.        Behavior: Behavior normal.        Thought Content: Thought content normal. Thought content is not paranoid or delusional. Thought content does not include homicidal or suicidal ideation. Thought content does not include homicidal or suicidal plan.        Cognition and Memory:  Cognition and memory normal.        Judgment: Judgment normal.     Comments: Insight intact     Lab Review:     Component Value Date/Time   NA 140 06/02/2019 1521   K 4.7 06/02/2019 1521   CL 101 06/02/2019 1521   CO2 25 06/02/2019 1521   GLUCOSE 85 06/02/2019 1521   GLUCOSE 108 (H) 01/20/2019 2237   BUN 20 06/02/2019 1521   CREATININE 0.94 06/02/2019 1521  CALCIUM 9.3 06/02/2019 1521   PROT 7.0 06/02/2019 1521   ALBUMIN 4.1 06/02/2019 1521   AST 15 06/02/2019 1521   ALT 17 06/02/2019 1521   ALKPHOS 44 06/02/2019 1521   BILITOT 0.2 06/02/2019 1521   GFRNONAA 91 06/02/2019 1521   GFRAA 105 06/02/2019 1521       Component Value Date/Time   WBC 9.0 06/02/2019 1521   WBC 9.1 01/20/2019 2237   RBC 4.63 06/02/2019 1521   RBC 4.54 01/20/2019 2237   HGB 14.0 06/02/2019 1521   HCT 41.0 06/02/2019 1521   PLT 266 06/02/2019 1521   MCV 89 06/02/2019 1521   MCH 30.2 06/02/2019 1521   MCH 30.4 01/20/2019 2237   MCHC 34.1 06/02/2019 1521   MCHC 32.9 01/20/2019 2237   RDW 12.4 06/02/2019 1521   LYMPHSABS 2.5 06/02/2019 1521   EOSABS 0.2 06/02/2019 1521   BASOSABS 0.1 06/02/2019 1521    No results found for: POCLITH, LITHIUM   Lab Results  Component Value Date   VALPROATE 34 (L) 06/02/2019    He reports that recent lipid panel was slightly elevated.  .res Assessment: Plan:   Agree with plan to schedule annual physical exam.  Will order CMP, CBC, and VPA to monitor for adverse effects of Depakote  Continue Depakote ER 1000 mg po QHS for mood stabilization.  Pt to f/u in 3-4 months or sooner if clinically indicated.  Patient advised to contact office with any questions, adverse effects, or acute worsening in signs and symptoms.  Derrick Romero was seen today for follow-up.  Diagnoses and all orders for this visit:  High risk medication use -     Comprehensive metabolic panel -     CBC -     Valproic acid level  Bipolar disorder, in partial remission, most recent episode  mixed (HCC) -     divalproex (DEPAKOTE ER) 500 MG 24 hr tablet; TAKE 2 TABLETS(1000 MG) BY MOUTH AT BEDTIME     Please see After Visit Summary for patient specific instructions.  Future Appointments  Date Time Provider [place]  10/15/2020  9:00 AM Thayer Headings, PMHNP CP-CP None    Orders Placed This Encounter  Procedures  . Comprehensive metabolic panel  . CBC  . Valproic acid level    -------------------------------

## 2020-09-04 IMAGING — CR DG CHEST 2V
1 series · 2 of 2 positions shown · non-contrast
Comparison: 06/28/2017

CLINICAL DATA: Cough and shortness of breath for several weeks,
recent negative [id number] test

EXAM:
CHEST - 2 VIEW

[Series 1: dg chest 2 view · 0.14mm/px · 2 of 2 slices shown]
[im 1/2]
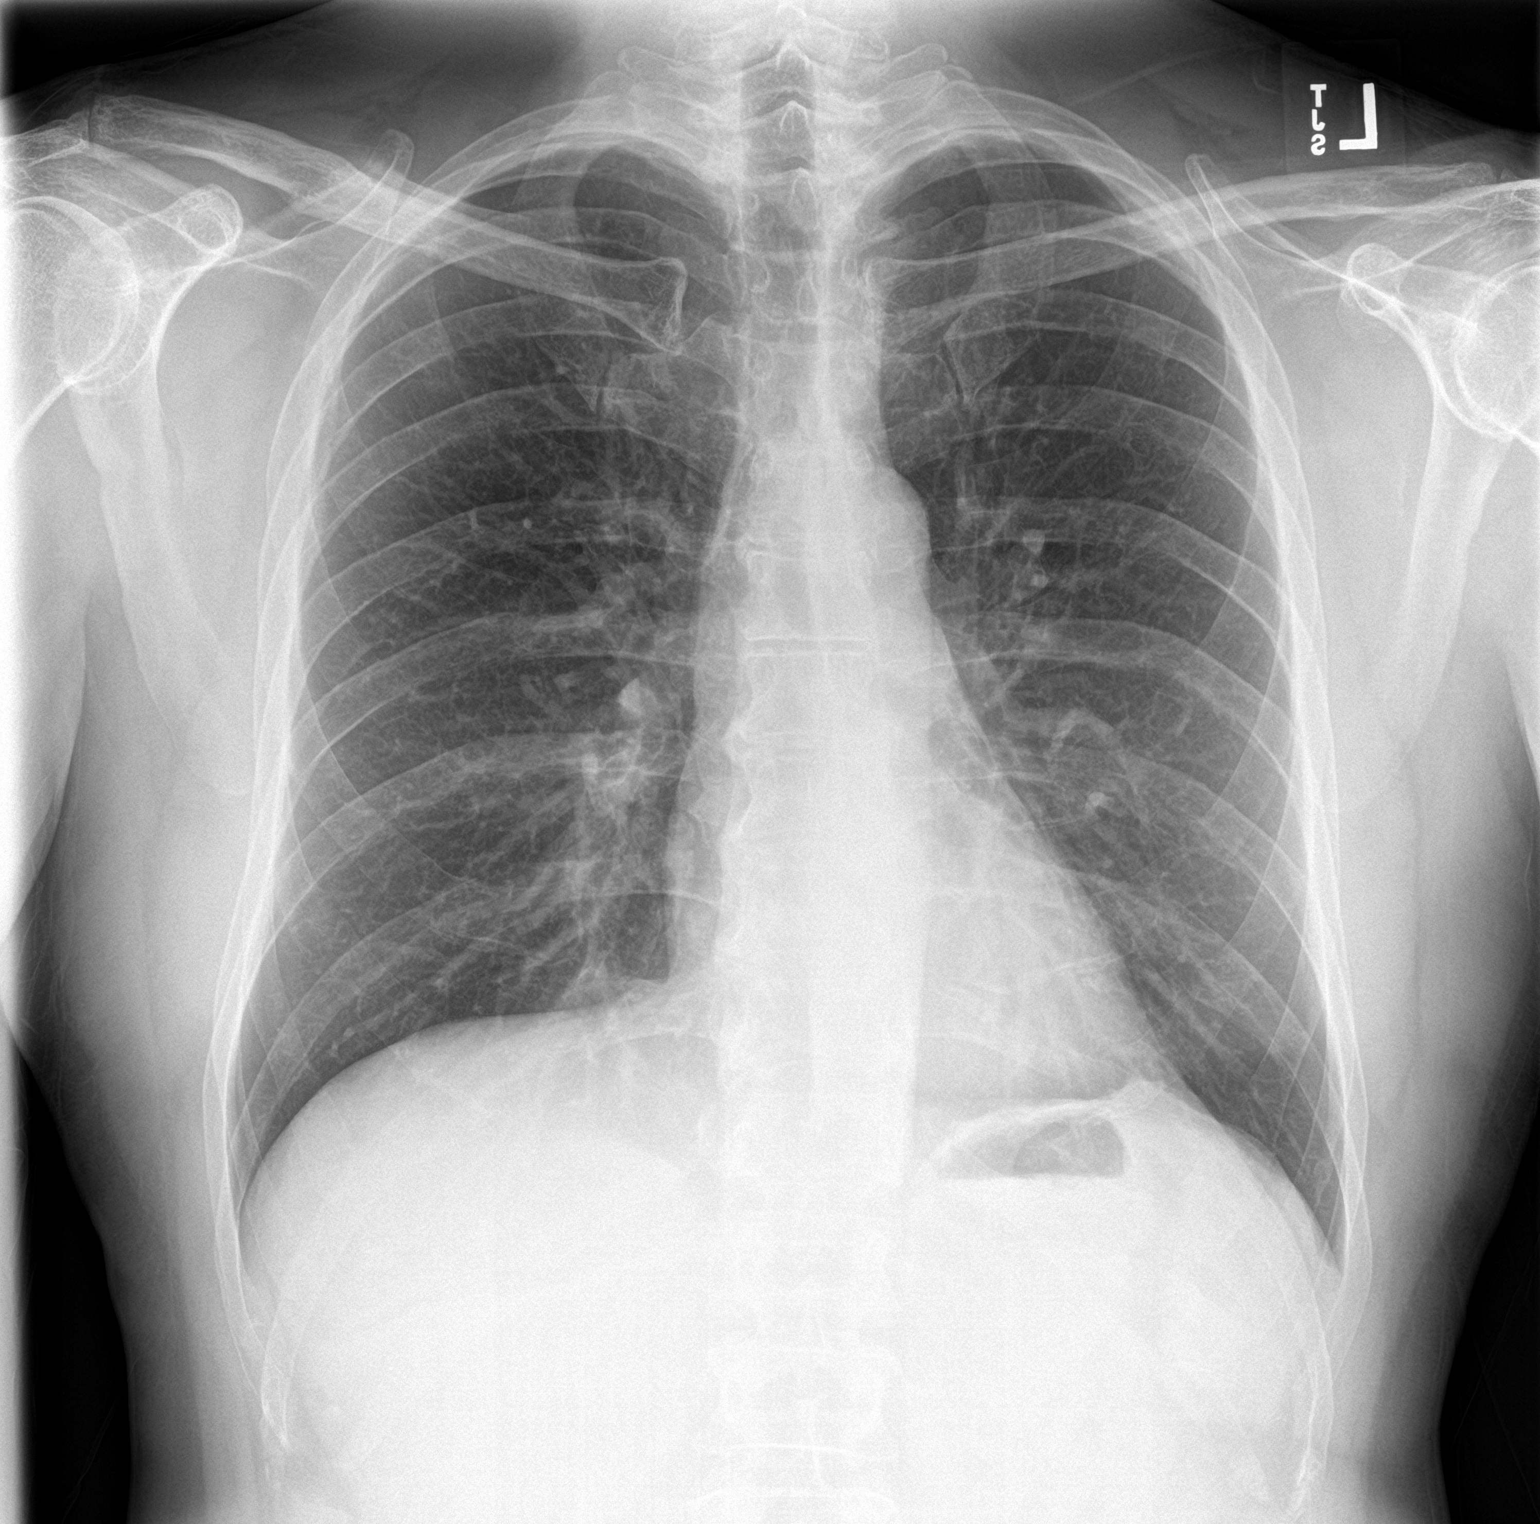
[im 2/2]
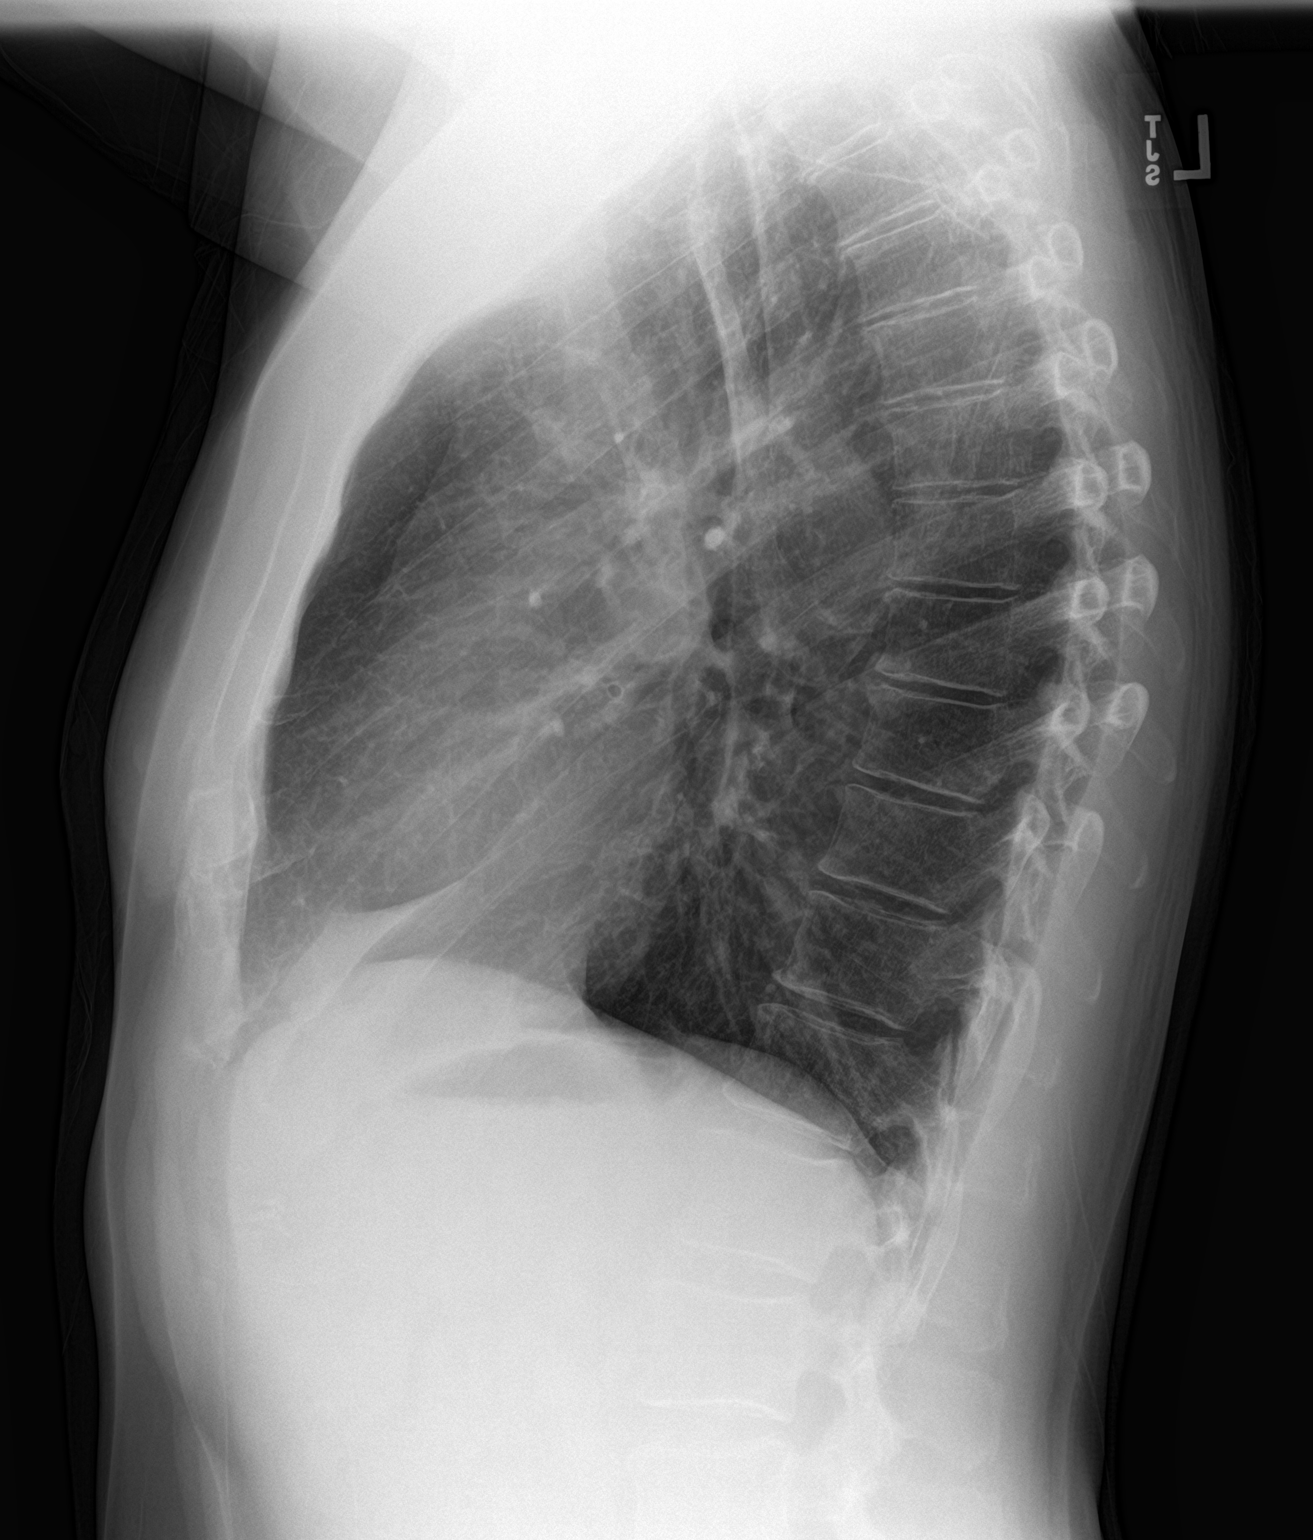

[2 of 2 positions shown; findings below may reference images not displayed]

FINDINGS: Cardiac shadow is within normal limits. The lungs are again mildly
hyperinflated. No focal infiltrate or sizable effusion is seen. No
parenchymal nodules are noted. No acute bony abnormality is noted.
IMPRESSION: COPD without acute abnormality.

## 2020-10-11 LAB — COMPREHENSIVE METABOLIC PANEL
ALT: 26 IU/L (ref 0–44)
AST: 17 IU/L (ref 0–40)
Albumin/Globulin Ratio: 1.6 (ref 1.2–2.2)
Albumin: 4.5 g/dL (ref 3.8–4.9)
Alkaline Phosphatase: 62 IU/L (ref 44–121)
BUN/Creatinine Ratio: 14 (ref 9–20)
BUN: 12 mg/dL (ref 6–24)
Bilirubin Total: 0.3 mg/dL (ref 0.0–1.2)
CO2: 22 mmol/L (ref 20–29)
Calcium: 9.6 mg/dL (ref 8.7–10.2)
Chloride: 99 mmol/L (ref 96–106)
Creatinine, Ser: 0.86 mg/dL (ref 0.76–1.27)
GFR calc Af Amer: 111 mL/min/{1.73_m2} (ref >59)
GFR calc non Af Amer: 96 mL/min/{1.73_m2} (ref >59)
Globulin, Total: 2.8 g/dL (ref 1.5–4.5)
Glucose: 92 mg/dL (ref 65–99)
Potassium: 4.4 mmol/L (ref 3.5–5.2)
Sodium: 137 mmol/L (ref 134–144)
Total Protein: 7.3 g/dL (ref 6.0–8.5)

## 2020-10-11 LAB — CBC
Hematocrit: 41.9 % (ref 37.5–51.0)
Hemoglobin: 14.6 g/dL (ref 13.0–17.7)
MCH: 30.8 pg (ref 26.6–33.0)
MCHC: 34.8 g/dL (ref 31.5–35.7)
MCV: 88 fL (ref 79–97)
Platelets: 262 10*3/uL (ref 150–450)
RBC: 4.74 x10E6/uL (ref 4.14–5.80)
RDW: 12.3 % (ref 11.6–15.4)
WBC: 8.7 10*3/uL (ref 3.4–10.8)

## 2020-10-11 LAB — VALPROIC ACID LEVEL: Valproic Acid Lvl: 42 ug/mL — ABNORMAL LOW (ref 50–100)

## 2020-10-15 ENCOUNTER — Ambulatory Visit (INDEPENDENT_AMBULATORY_CARE_PROVIDER_SITE_OTHER): Payer: 59 | Admitting: Psychiatry

## 2020-10-15 ENCOUNTER — Encounter: Payer: Self-pay | Admitting: Psychiatry

## 2020-10-15 ENCOUNTER — Other Ambulatory Visit: Payer: Self-pay

## 2020-10-15 DIAGNOSIS — F3177 Bipolar disorder, in partial remission, most recent episode mixed: Secondary | ICD-10-CM

## 2020-10-15 MED ORDER — DIVALPROEX SODIUM ER 500 MG PO TB24: ORAL_TABLET | ORAL | 1 refills | Status: DC

## 2020-10-15 MED ORDER — DEPLIN 15 15-90.314 MG PO CAPS: 15.0000 mg | ORAL_CAPSULE | Freq: Every day | ORAL | 0 refills | Status: DC

## 2020-10-15 NOTE — Progress Notes (Signed)
Derrick Romero [id number] [birth year] 59 y.o.  Subjective:   Patient ID:  Derrick Romero is a 59 y.o. (DOB [birth year]) male.  Chief Complaint:  Chief Complaint  Patient presents with  . Follow-up    Mood disturbance    HPI Derrick Romero presents to the office today for follow-up of mood disorder.  He denies any complaints. He describes mood as "very even keeled." Denies depressed mood or irritability. Denies anxiety. Sleeping "pretty good." He reports that his sleep had been consistent and then had some change in sleep pattern with time change. Sleeping at least 6.5 years. Energy and motivation have been somewhat lower in the fall months. Appetite has been good. Adequate concentration and focus. Denies anhedonia.Denies any impulsive or risky behavior.  Denies SI.   Was able to see family at Thanksgiving.   Continuing to apply for jobs. Wife continues to live at her brother's. They have been going out to dinner once a week. Reports that daughter is about to get engaged.     [facility] Office Visit from 06/28/2017 in [place]  PHQ-2 Total Score 0       Review of Systems:  Review of Systems  Eyes:       Floaters  Musculoskeletal: Positive for arthralgias. Negative for gait problem.  Neurological: Negative for tremors.  Psychiatric/Behavioral:       Please refer to HPI    Medications: I have reviewed the patient's current medications.  Current Outpatient Medications  Medication Sig Dispense Refill  . cholecalciferol (VITAMIN D3) 25 MCG (1000 UT) tablet Take 1,000 Units by mouth daily.    . fexofenadine (ALLEGRA) 180 MG tablet Take 180 mg by mouth daily as needed.     . Glucosamine-Chondroitin (GLUCOSAMINE CHONDR COMPLEX PO) Take by mouth.    . Multiple Vitamin (MULTIVITAMIN) tablet Take 1 tablet by mouth daily.    . psyllium (METAMUCIL) 58.6 % packet Take 1 packet by mouth daily.    Marland Kitchen albuterol (VENTOLIN HFA) 108 (90 Base) MCG/ACT inhaler  Inhale 2 puffs into the lungs every 6 (six) hours as needed for wheezing or shortness of breath. 54 g 1  . divalproex (DEPAKOTE ER) 500 MG 24 hr tablet TAKE 2 TABLETS(1000 MG) BY MOUTH AT BEDTIME 180 tablet 1  . fluticasone (FLONASE) 50 MCG/ACT nasal spray Place into both nostrils daily. (Patient not taking: Reported on 10/15/2020)    . L-Methylfolate-Algae (DEPLIN 15) 15-90.314 MG CAPS Take 15 mg by mouth daily. 30 capsule 0   No current facility-administered medications for this visit.    Medication Side Effects: Other: Wt gain  Allergies:  Allergies  Allergen Reactions  . Oak Bark  [Quercus Robur] Other (See Comments) and Shortness Of Breath    Past Medical History:  Diagnosis Date  . Allergic rhinitis   . Dupuytren's disease     Family History  Problem Relation Age of Onset  . Asthma Father   . Melanoma Father   . Cancer Maternal Grandmother   . Heart disease Paternal Grandfather   . ADD / ADHD Nephew     Social History   Socioeconomic History  . Marital status: Married    Spouse name: Not on file  . Number of children: 2  . Years of education: 68  . Highest education level: Not on file  Occupational History  . Occupation: Facilities manager  Tobacco Use  . Smoking status: Never Smoker  . Smokeless tobacco: Never Used  Substance  and Sexual Activity  . Alcohol use: Yes    Alcohol/week: 6.0 standard drinks    Types: 6 Cans of beer per week    Comment: occasional  . Drug use: No  . Sexual activity: Yes    Partners: Female  Other Topics Concern  . Not on file  Social History Narrative  . Not on file   Social Determinants of Health   Financial Resource Strain: Not on file  Food Insecurity: Not on file  Transportation Needs: Not on file  Physical Activity: Not on file  Stress: Not on file  Social Connections: Not on file  Intimate Partner Violence: Not on file    Past Medical History, Surgical history, Social history, and Family history were reviewed  and updated as appropriate.   Please see review of systems for further details on the patient's review from today.   Objective:   Physical Exam:  There were no vitals taken for this visit.  Physical Exam Constitutional:      General: He is not in acute distress. Musculoskeletal:        General: No deformity.  Neurological:     Mental Status: He is alert and oriented to person, place, and time.     Coordination: Coordination normal.  Psychiatric:        Attention and Perception: Attention and perception normal. He does not perceive auditory or visual hallucinations.        Mood and Affect: Mood normal. Mood is not anxious or depressed. Affect is not labile, blunt, angry or inappropriate.        Speech: Speech normal.        Behavior: Behavior normal.        Thought Content: Thought content normal. Thought content is not paranoid or delusional. Thought content does not include homicidal or suicidal ideation. Thought content does not include homicidal or suicidal plan.        Cognition and Memory: Cognition and memory normal.        Judgment: Judgment normal.     Comments: Insight intact     Lab Review:     Component Value Date/Time   NA 137 10/10/2020 1021   K 4.4 10/10/2020 1021   CL 99 10/10/2020 1021   CO2 22 10/10/2020 1021   GLUCOSE 92 10/10/2020 1021   GLUCOSE 108 (H) 01/20/2019 2237   BUN 12 10/10/2020 1021   CREATININE 0.86 10/10/2020 1021   CALCIUM 9.6 10/10/2020 1021   PROT 7.3 10/10/2020 1021   ALBUMIN 4.5 10/10/2020 1021   AST 17 10/10/2020 1021   ALT 26 10/10/2020 1021   ALKPHOS 62 10/10/2020 1021   BILITOT 0.3 10/10/2020 1021   GFRNONAA 96 10/10/2020 1021   GFRAA 111 10/10/2020 1021       Component Value Date/Time   WBC 8.7 10/10/2020 1021   WBC 9.1 01/20/2019 2237   RBC 4.74 10/10/2020 1021   RBC 4.54 01/20/2019 2237   HGB 14.6 10/10/2020 1021   HCT 41.9 10/10/2020 1021   PLT 262 10/10/2020 1021   MCV 88 10/10/2020 1021   MCH 30.8 10/10/2020  1021   MCH 30.4 01/20/2019 2237   MCHC 34.8 10/10/2020 1021   MCHC 32.9 01/20/2019 2237   RDW 12.3 10/10/2020 1021   LYMPHSABS 2.5 06/02/2019 1521   EOSABS 0.2 06/02/2019 1521   BASOSABS 0.1 06/02/2019 1521    No results found for: POCLITH, LITHIUM   Lab Results  Component Value Date   VALPROATE 42 (L) 10/10/2020     .  res Assessment: Plan:   Pt seen for 30 minutes and time spent reviewing recent lab results.  Discussed potential benefits, risks, and side effects of Deplin since pt reports that he would like some help with energy and mild depression and prefers to minimize use of medication.  Pt agrees to trial of Deplin 15 m po qd for augmentation of depression.  Continue Depakote ER 1000 mg po QHS for mood stabilization.  Pt to follow-up in 6 months or sooner if clinically indicated.  Patient advised to contact office with any questions, adverse effects, or acute worsening in signs and symptoms.  Darryn was seen today for follow-up.  Diagnoses and all orders for this visit:  Bipolar disorder, in partial remission, most recent episode mixed (HCC) -     divalproex (DEPAKOTE ER) 500 MG 24 hr tablet; TAKE 2 TABLETS(1000 MG) BY MOUTH AT BEDTIME  Other orders -     L-Methylfolate-Algae (DEPLIN 15) 15-90.314 MG CAPS; Take 15 mg by mouth daily.     Please see After Visit Summary for patient specific instructions.  Future Appointments  Date Time Provider [place]  04/15/2021  9:00 AM Thayer Headings, PMHNP CP-CP None    No orders of the defined types were placed in this encounter.   -------------------------------

## 2021-04-15 ENCOUNTER — Ambulatory Visit: Payer: 59 | Admitting: Psychiatry

## 2021-04-17 ENCOUNTER — Telehealth: Payer: Self-pay | Admitting: Psychiatry

## 2021-04-17 ENCOUNTER — Other Ambulatory Visit: Payer: Self-pay

## 2021-04-17 DIAGNOSIS — F3177 Bipolar disorder, in partial remission, most recent episode mixed: Secondary | ICD-10-CM

## 2021-04-17 MED ORDER — DIVALPROEX SODIUM ER 500 MG PO TB24: ORAL_TABLET | ORAL | 1 refills | Status: DC

## 2021-04-17 NOTE — Telephone Encounter (Signed)
Pt called in regarding prescription for Divalproex 500mg . He states that last week when he had prescription filled he was only given 10 instead of 180. He would like a new prescription sent in for the correct amount. Appt 6/28. Pls RTC [id number]. [place], [place]

## 2021-04-17 NOTE — Telephone Encounter (Signed)
Rx sent 

## 2021-04-29 ENCOUNTER — Ambulatory Visit (INDEPENDENT_AMBULATORY_CARE_PROVIDER_SITE_OTHER): Payer: 59 | Admitting: Psychiatry

## 2021-04-29 ENCOUNTER — Other Ambulatory Visit: Payer: Self-pay

## 2021-04-29 ENCOUNTER — Encounter: Payer: Self-pay | Admitting: Psychiatry

## 2021-04-29 VITALS — Wt 198.0 lb

## 2021-04-29 DIAGNOSIS — R5383 Other fatigue: Secondary | ICD-10-CM | POA: Diagnosis not present

## 2021-04-29 DIAGNOSIS — F3177 Bipolar disorder, in partial remission, most recent episode mixed: Secondary | ICD-10-CM

## 2021-04-29 DIAGNOSIS — Z79899 Other long term (current) drug therapy: Secondary | ICD-10-CM | POA: Diagnosis not present

## 2021-04-29 NOTE — Progress Notes (Signed)
Derrick Romero [id number] 11/04/1960 60 y.o.  Subjective:   Patient ID:  Derrick Romero is a 60 y.o. (DOB 03-Mar-1961) male.  Chief Complaint:  Chief Complaint  Patient presents with   Follow-up    H/o mood disturbance    HPI Derrick Romero presents to the office today for follow-up of mood disturbance. He reports that he has been feeling well. Denies depressed or elevated mood. Denies anxiety. He reports that sleep has been up and down again after being consistent in the spring. He thinks that longer days may be contributing to sleep disturbance. He reports that he has some good nights of sleep and then some nights of disrupted sleep. Appetite has been more than he would like. Energy has improved with walking. Motivation has been ok. Denies difficulty with concentration. Denies impulsive or risky behavior. Denies SI.   Has been walking more consistently. Has family vacation planned in mid-August. He and his wife continue to have dinner together once a week and she lives at her brother's. Saw daughter in [place] over father's day. He continues to look for work.   Reports Deplin was not effective.   [facility] Office Visit from 06/28/2017 in [place]  PHQ-2 Total Score 0        Review of Systems:  Review of Systems  Respiratory:  Negative for shortness of breath.   Musculoskeletal:  Negative for gait problem.  Allergic/Immunologic: Positive for environmental allergies.  Psychiatric/Behavioral:         Please refer to HPI   Medications: I have reviewed the patient's current medications.  Current Outpatient Medications  Medication Sig Dispense Refill   Ascorbic Acid (VITAMIN C) 100 MG tablet Take 100 mg by mouth daily.     cholecalciferol (VITAMIN D3) 25 MCG (1000 UT) tablet Take 1,000 Units by mouth daily.     divalproex (DEPAKOTE ER) 500 MG 24 hr tablet TAKE 2 TABLETS(1000 MG) BY MOUTH AT BEDTIME 180 tablet 1   fexofenadine (ALLEGRA) 180 MG  tablet Take 180 mg by mouth daily as needed.      Glucosamine-Chondroitin (GLUCOSAMINE CHONDR COMPLEX PO) Take by mouth.     Multiple Vitamin (MULTIVITAMIN) tablet Take 1 tablet by mouth daily.     psyllium (METAMUCIL) 58.6 % packet Take 1 packet by mouth daily.     albuterol (VENTOLIN HFA) 108 (90 Base) MCG/ACT inhaler Inhale 2 puffs into the lungs every 6 (six) hours as needed for wheezing or shortness of breath. 54 g 1   fluticasone (FLONASE) 50 MCG/ACT nasal spray Place into both nostrils daily. (Patient not taking: Reported on 10/15/2020)     No current facility-administered medications for this visit.    Medication Side Effects: Other: Weight gain  Allergies:  Allergies  Allergen Reactions   Oak Bark  [Quercus Robur] Other (See Comments) and Shortness Of Breath    Past Medical History:  Diagnosis Date   Allergic rhinitis    Dupuytren's disease     Past Medical History, Surgical history, Social history, and Family history were reviewed and updated as appropriate.   Please see review of systems for further details on the patient's review from today.   Objective:   Physical Exam:  Wt 198 lb (89.8 kg)   BMI 28.41 kg/m   Physical Exam Constitutional:      General: He is not in acute distress. Musculoskeletal:        General: No deformity.  Neurological:  Mental Status: He is alert and oriented to person, place, and time.     Coordination: Coordination normal.  Psychiatric:        Attention and Perception: Attention and perception normal. He does not perceive auditory or visual hallucinations.        Mood and Affect: Mood normal. Mood is not anxious or depressed. Affect is not labile, blunt, angry or inappropriate.        Speech: Speech normal.        Behavior: Behavior normal.        Thought Content: Thought content normal. Thought content is not paranoid or delusional. Thought content does not include homicidal or suicidal ideation. Thought content does not  include homicidal or suicidal plan.        Cognition and Memory: Cognition and memory normal.        Judgment: Judgment normal.     Comments: Insight intact    Lab Review:     Component Value Date/Time   NA 137 10/10/2020 1021   K 4.4 10/10/2020 1021   CL 99 10/10/2020 1021   CO2 22 10/10/2020 1021   GLUCOSE 92 10/10/2020 1021   GLUCOSE 108 (H) 01/20/2019 2237   BUN 12 10/10/2020 1021   CREATININE 0.86 10/10/2020 1021   CALCIUM 9.6 10/10/2020 1021   PROT 7.3 10/10/2020 1021   ALBUMIN 4.5 10/10/2020 1021   AST 17 10/10/2020 1021   ALT 26 10/10/2020 1021   ALKPHOS 62 10/10/2020 1021   BILITOT 0.3 10/10/2020 1021   GFRNONAA 96 10/10/2020 1021   GFRAA 111 10/10/2020 1021       Component Value Date/Time   WBC 8.7 10/10/2020 1021   WBC 9.1 01/20/2019 2237   RBC 4.74 10/10/2020 1021   RBC 4.54 01/20/2019 2237   HGB 14.6 10/10/2020 1021   HCT 41.9 10/10/2020 1021   PLT 262 10/10/2020 1021   MCV 88 10/10/2020 1021   MCH 30.8 10/10/2020 1021   MCH 30.4 01/20/2019 2237   MCHC 34.8 10/10/2020 1021   MCHC 32.9 01/20/2019 2237   RDW 12.3 10/10/2020 1021   LYMPHSABS 2.5 06/02/2019 1521   EOSABS 0.2 06/02/2019 1521   BASOSABS 0.1 06/02/2019 1521    No results found for: POCLITH, LITHIUM   Lab Results  Component Value Date   VALPROATE 42 (L) 10/10/2020     .res Assessment: Plan:   Pt seen for 30 minutes and time spent counseling pt regarding c/o fatigue and discussed obtaining labs to r/o medical cause of fatigue and to also assess for potential adverse effects of Depakote. Will order CBC, CMP, Vitamin B 12, Vitamin D, and Testosterone. Will also order Hgb A1C and Lipid Panel since these values were elevated 2 years ago and have not been re-checked. Will continue current plan of care since target signs and symptoms are well controlled without any tolerability issues. Continue Depakote ER 1000 mg po QHS for mood stabilization.  Pt to follow-up in 6 months or sooner if  clinically indicated.  Patient advised to contact office with any questions, adverse effects, or acute worsening in signs and symptoms.   Tameem was seen today for follow-up.  Diagnoses and all orders for this visit:  Bipolar disorder, in partial remission, most recent episode mixed (HCC)  High risk medication use -     CBC -     Comprehensive metabolic panel -     Vitamin B12 -     Lipid panel  Fatigue, unspecified type -  VITAMIN D 25 Hydroxy (Vit-D Deficiency, Fractures) -     Testosterone -     Hemoglobin A1c    Please see After Visit Summary for patient specific instructions.  Future Appointments  Date Time Provider [place]  10/29/2021  9:30 AM Thayer Headings, PMHNP CP-CP None    Orders Placed This Encounter  Procedures   CBC   Comprehensive metabolic panel   VITAMIN D 25 Hydroxy (Vit-D Deficiency, Fractures)   Vitamin B12   Testosterone   Hemoglobin A1c   Lipid panel    -------------------------------

## 2021-05-24 LAB — CBC
Hematocrit: 44 % (ref 37.5–51.0)
Hemoglobin: 14.9 g/dL (ref 13.0–17.7)
MCH: 30.5 pg (ref 26.6–33.0)
MCHC: 33.9 g/dL (ref 31.5–35.7)
MCV: 90 fL (ref 79–97)
Platelets: 296 10*3/uL (ref 150–450)
RBC: 4.88 x10E6/uL (ref 4.14–5.80)
RDW: 12.5 % (ref 11.6–15.4)
WBC: 8.1 10*3/uL (ref 3.4–10.8)

## 2021-05-24 LAB — VITAMIN D 25 HYDROXY (VIT D DEFICIENCY, FRACTURES): Vit D, 25-Hydroxy: 40.9 ng/mL (ref 30.0–100.0)

## 2021-05-24 LAB — COMPREHENSIVE METABOLIC PANEL
ALT: 32 IU/L (ref 0–44)
AST: 28 IU/L (ref 0–40)
Albumin/Globulin Ratio: 2.1 (ref 1.2–2.2)
Albumin: 4.9 g/dL (ref 3.8–4.9)
Alkaline Phosphatase: 65 IU/L (ref 44–121)
BUN/Creatinine Ratio: 11 (ref 9–20)
BUN: 10 mg/dL (ref 6–24)
Bilirubin Total: 0.4 mg/dL (ref 0.0–1.2)
CO2: 24 mmol/L (ref 20–29)
Calcium: 9.9 mg/dL (ref 8.7–10.2)
Chloride: 97 mmol/L (ref 96–106)
Creatinine, Ser: 0.95 mg/dL (ref 0.76–1.27)
Globulin, Total: 2.3 g/dL (ref 1.5–4.5)
Glucose: 150 mg/dL — ABNORMAL HIGH (ref 65–99)
Potassium: 4.7 mmol/L (ref 3.5–5.2)
Sodium: 137 mmol/L (ref 134–144)
Total Protein: 7.2 g/dL (ref 6.0–8.5)
eGFR: 93 mL/min/{1.73_m2} (ref >59)

## 2021-05-24 LAB — HEMOGLOBIN A1C
Est. average glucose Bld gHb Est-mCnc: 123 mg/dL
Hgb A1c MFr Bld: 5.9 % — ABNORMAL HIGH (ref 4.8–5.6)

## 2021-05-24 LAB — LIPID PANEL
Chol/HDL Ratio: 5.7 ratio — ABNORMAL HIGH (ref 0.0–5.0)
Cholesterol, Total: 264 mg/dL — ABNORMAL HIGH (ref 100–199)
HDL: 46 mg/dL (ref >39)
LDL Chol Calc (NIH): 182 mg/dL — ABNORMAL HIGH (ref 0–99)
Triglycerides: 190 mg/dL — ABNORMAL HIGH (ref 0–149)
VLDL Cholesterol Cal: 36 mg/dL (ref 5–40)

## 2021-05-24 LAB — VITAMIN B12: Vitamin B-12: 687 pg/mL (ref 232–1245)

## 2021-05-24 LAB — TESTOSTERONE: Testosterone: 287 ng/dL (ref 264–916)

## 2021-05-26 ENCOUNTER — Telehealth: Payer: Self-pay | Admitting: Psychiatry

## 2021-05-26 DIAGNOSIS — Z79899 Other long term (current) drug therapy: Secondary | ICD-10-CM

## 2021-05-26 NOTE — Telephone Encounter (Signed)
Pt stated he did not fast,gave him info as well

## 2021-05-26 NOTE — Telephone Encounter (Signed)
Please contact pt and review labs. Please ask if he was fasting when he had labs drawn. If he was fasting, recommend he follow-up with PCP re: elevated glucose levels and cholesterol. Otherwise, results were WNL and there was not an indication of adverse effects with Depakote.

## 2021-05-27 NOTE — Telephone Encounter (Signed)
Please let him know it has been sent.

## 2021-05-27 NOTE — Telephone Encounter (Signed)
Pt stated he would like to have another order sent to [facility]

## 2021-06-12 LAB — GLUCOSE, FASTING: Glucose, Plasma: 104 mg/dL — ABNORMAL HIGH (ref 65–99)

## 2021-06-12 LAB — LIPID PANEL
Chol/HDL Ratio: 5.3 ratio — ABNORMAL HIGH (ref 0.0–5.0)
Cholesterol, Total: 274 mg/dL — ABNORMAL HIGH (ref 100–199)
HDL: 52 mg/dL (ref >39)
LDL Chol Calc (NIH): 187 mg/dL — ABNORMAL HIGH (ref 0–99)
Triglycerides: 186 mg/dL — ABNORMAL HIGH (ref 0–149)
VLDL Cholesterol Cal: 35 mg/dL (ref 5–40)

## 2021-06-13 NOTE — Telephone Encounter (Signed)
Pt informed

## 2021-06-13 NOTE — Telephone Encounter (Signed)
Please let him know that fasting glucose was slightly elevated. Recommend he follow-up with PCP since his Hgb A1C has also been slightly elevated in the past. Cholesterol also is elevated and he may want to discuss this with his PCP as well.

## 2021-09-15 ENCOUNTER — Other Ambulatory Visit: Payer: Self-pay

## 2021-09-15 ENCOUNTER — Encounter: Payer: Self-pay | Admitting: Family Medicine

## 2021-09-15 ENCOUNTER — Ambulatory Visit (INDEPENDENT_AMBULATORY_CARE_PROVIDER_SITE_OTHER): Payer: Managed Care, Other (non HMO) | Admitting: Family Medicine

## 2021-09-15 VITALS — BP 135/81 | HR 68 | Resp 16 | Ht 70.0 in | Wt 186.7 lb

## 2021-09-15 DIAGNOSIS — N401 Enlarged prostate with lower urinary tract symptoms: Secondary | ICD-10-CM | POA: Insufficient documentation

## 2021-09-15 DIAGNOSIS — M542 Cervicalgia: Secondary | ICD-10-CM | POA: Insufficient documentation

## 2021-09-15 DIAGNOSIS — J321 Chronic frontal sinusitis: Secondary | ICD-10-CM | POA: Insufficient documentation

## 2021-09-15 DIAGNOSIS — Z0000 Encounter for general adult medical examination without abnormal findings: Secondary | ICD-10-CM | POA: Insufficient documentation

## 2021-09-15 DIAGNOSIS — M545 Low back pain, unspecified: Secondary | ICD-10-CM | POA: Insufficient documentation

## 2021-09-15 DIAGNOSIS — Z23 Encounter for immunization: Secondary | ICD-10-CM | POA: Diagnosis not present

## 2021-09-15 DIAGNOSIS — R7303 Prediabetes: Secondary | ICD-10-CM

## 2021-09-15 DIAGNOSIS — F3110 Bipolar disorder, current episode manic without psychotic features, unspecified: Secondary | ICD-10-CM

## 2021-09-15 DIAGNOSIS — Z125 Encounter for screening for malignant neoplasm of prostate: Secondary | ICD-10-CM | POA: Insufficient documentation

## 2021-09-15 DIAGNOSIS — M720 Palmar fascial fibromatosis [Dupuytren]: Secondary | ICD-10-CM

## 2021-09-15 DIAGNOSIS — R3915 Urgency of urination: Secondary | ICD-10-CM

## 2021-09-15 DIAGNOSIS — R351 Nocturia: Secondary | ICD-10-CM

## 2021-09-15 MED ORDER — SILDENAFIL CITRATE 100 MG PO TABS: 50.0000 mg | ORAL_TABLET | Freq: Every day | ORAL | 11 refills | Status: DC | PRN

## 2021-09-15 MED ORDER — TAMSULOSIN HCL 0.4 MG PO CAPS: 0.4000 mg | ORAL_CAPSULE | Freq: Every day | ORAL | 3 refills | Status: DC

## 2021-09-15 MED ORDER — PREDNISONE 20 MG PO TABS: 20.0000 mg | ORAL_TABLET | Freq: Every day | ORAL | 0 refills | Status: DC

## 2021-09-15 NOTE — Assessment & Plan Note (Signed)
Complaints of nocturia screening

## 2021-09-15 NOTE — Assessment & Plan Note (Signed)
Exam deferred Will try flomax to determine if s/s improve Discussed decrease in use of stimulants ex coffee

## 2021-09-15 NOTE — Assessment & Plan Note (Signed)
Recommend ergonomics of positioning for typing to assist with upper neck pain

## 2021-09-15 NOTE — Assessment & Plan Note (Signed)
provided ?

## 2021-09-15 NOTE — Assessment & Plan Note (Signed)
UTD on dental and eye exams Things to do to keep yourself healthy  - Exercise at least 30-45 minutes a day, 3-4 days a week.  - Eat a low-fat diet with lots of fruits and vegetables, up to 7-9 servings per day.  - Seatbelts can save your life. Wear them always.  - Smoke detectors on every level of your home, check batteries every year.  - Eye Doctor - have an eye exam every 1-2 years  - Safe sex - if you may be exposed to STDs, use a condom.  - Alcohol -  If you drink, do it moderately, less than 2 drinks per day.  - Mize. Choose someone to speak for you if you are not able.  - Depression is common in our stressful world.If you're feeling down or losing interest in things you normally enjoy, please come in for a visit.  - Violence - If anyone is threatening or hurting you, please call immediately.

## 2021-09-15 NOTE — Assessment & Plan Note (Signed)
Chronic, stable Followed by psych Medication caused weight gain Pt working on being more active Weight has been variable between 160-200 over past 5 years

## 2021-09-15 NOTE — Assessment & Plan Note (Signed)
Chronic; manageable Due for injections in bilateral hands Mainly affecting 4/5th finger Followed by specialist at [facility] Does not impair ADLs

## 2021-09-15 NOTE — Assessment & Plan Note (Signed)
Suggest changing foot wear q 6 months Ex fleet feet for supportive foot wear based on foot shape/size and use of shoe Recommend 800 ibupropfen q8 hrs for 3 days max Refer to ortho if needed

## 2021-09-15 NOTE — Assessment & Plan Note (Signed)
Down 15 lbs since 8/22 Purposeful weight loss Walking/hiking 30 miles/5-6 days/week repeat

## 2021-09-15 NOTE — Assessment & Plan Note (Signed)
Trial of flomax

## 2021-09-15 NOTE — Progress Notes (Signed)
Complete physical exam   Patient: Derrick Romero   DOB: 1961-07-23   60 y.o. Male  MRN: [id number] Visit Date: 09/15/2021  Today's healthcare provider: Gwyneth Sprout, FNP   Chief Complaint  Patient presents with   Annual Exam   Subjective      HPI  Derrick Romero is a 60 y.o. male who presents today for a complete physical exam.  He reports consuming a general diet. Home exercise routine includes walking. He generally feels fairly well. He reports sleeping poorly, on average patient reports he sleeps 5-5.5hrs a night.Patient states that he wake [place] frequently at night to urinate. He does have additional problems to discuss today, patient reports that for the past month he has been suffering with allergies.   Past Medical History:  Diagnosis Date   Allergic rhinitis    Dupuytren's disease    Past Surgical History:  Procedure Laterality Date   COLONOSCOPY WITH PROPOFOL N/A 01/12/2019   Procedure: COLONOSCOPY WITH PROPOFOL;  Surgeon: Virgel Manifold, MD;  Location: [facility] ENDOSCOPY;  Service: Endoscopy;  Laterality: N/A;   ETHMOIDECTOMY     KNEE ARTHROSCOPY     Social History   Socioeconomic History   Marital status: Married    Spouse name: Not on file   Number of children: 2   Years of education: 16   Highest education level: Not on file  Occupational History   Occupation: Facilities manager  Tobacco Use   Smoking status: Never   Smokeless tobacco: Never  Substance and Sexual Activity   Alcohol use: Yes    Alcohol/week: 6.0 standard drinks    Types: 6 Cans of beer per week    Comment: occasional   Drug use: No   Sexual activity: Yes    Partners: Female  Other Topics Concern   Not on file  Social History Narrative   Not on file   Social Determinants of Health   Financial Resource Strain: Not on file  Food Insecurity: Not on file  Transportation Needs: Not on file  Physical Activity: Not on file  Stress: Not on file  Social Connections: Not on file   Intimate Partner Violence: Not on file   Family Status  Relation Name Status   Mother  Alive   Father  Alive   Brother  Alive   Daughter  Alive   Son  Alive   MGM  Deceased   MGF  Deceased   PGM  Deceased   PGF  Deceased   Brother  Passenger transport manager  (Not Specified)   Family History  Problem Relation Age of Onset   Asthma Father    Melanoma Father    Cancer Maternal Grandmother    Heart disease Paternal Grandfather    ADD / ADHD Nephew    Allergies  Allergen Reactions   Oak Bark  [Quercus Robur] Other (See Comments) and Shortness Of Breath    Patient Care Team: Virginia Crews, MD as PCP - General (Family Medicine)   Medications: Outpatient Medications Prior to Visit  Medication Sig   albuterol (VENTOLIN HFA) 108 (90 Base) MCG/ACT inhaler Inhale 2 puffs into the lungs every 6 (six) hours as needed for wheezing or shortness of breath.   Ascorbic Acid (VITAMIN C) 100 MG tablet Take 100 mg by mouth daily.   cholecalciferol (VITAMIN D3) 25 MCG (1000 UT) tablet Take 1,000 Units by mouth daily.   divalproex (DEPAKOTE ER) 500 MG 24 hr tablet  TAKE 2 TABLETS(1000 MG) BY MOUTH AT BEDTIME   fexofenadine (ALLEGRA) 180 MG tablet Take 180 mg by mouth daily as needed.    fluticasone (FLONASE) 50 MCG/ACT nasal spray Place into both nostrils daily.   Glucosamine-Chondroitin (GLUCOSAMINE CHONDR COMPLEX PO) Take by mouth.   Multiple Vitamin (MULTIVITAMIN) tablet Take 1 tablet by mouth daily.   psyllium (METAMUCIL) 58.6 % packet Take 1 packet by mouth daily.   No facility-administered medications prior to visit.    Review of Systems  HENT:  Positive for congestion, ear discharge, postnasal drip and sneezing.   Eyes:  Positive for photophobia, itching and visual disturbance.  Respiratory:  Positive for cough.   Endocrine: Positive for polyuria.  Genitourinary:  Positive for difficulty urinating, frequency and urgency.  Musculoskeletal:  Positive for back pain and neck stiffness.   Allergic/Immunologic: Positive for environmental allergies.     Objective    BP 135/81   Pulse 68   Resp 16   Ht 5\' 10"  (1.778 m)   Wt 186 lb 11.2 oz (84.7 kg)   SpO2 100%   BMI 26.79 kg/m    Physical Exam Vitals and nursing note reviewed.  Constitutional:      General: He is awake. He is not in acute distress.    Appearance: Normal appearance. He is well-developed and well-groomed. He is not ill-appearing, toxic-appearing or diaphoretic.  HENT:     Head: Normocephalic and atraumatic.     Jaw: There is normal jaw occlusion. No trismus, tenderness, swelling or pain on movement.     Salivary Glands: Right salivary gland is not diffusely enlarged or tender. Left salivary gland is not diffusely enlarged or tender.     Right Ear: Hearing, tympanic membrane, ear canal and external ear normal. There is no impacted cerumen.     Left Ear: Hearing, tympanic membrane, ear canal and external ear normal. Swelling and tenderness present. There is no impacted cerumen.     Nose: Congestion and rhinorrhea present.     Right Turbinates: Not enlarged, swollen or pale.     Left Turbinates: Not enlarged, swollen or pale.     Right Sinus: Frontal sinus tenderness present. No maxillary sinus tenderness.     Left Sinus: Frontal sinus tenderness present. No maxillary sinus tenderness.     Mouth/Throat:     Lips: Pink.     Mouth: Mucous membranes are moist. No injury, lacerations, oral lesions or angioedema.     Pharynx: Oropharynx is clear. Uvula midline. No pharyngeal swelling, oropharyngeal exudate or posterior oropharyngeal erythema.     Tonsils: No tonsillar exudate or tonsillar abscesses.  Eyes:     General: Lids are normal. Vision grossly intact. Gaze aligned appropriately.        Right eye: No discharge.        Left eye: No discharge.     Extraocular Movements: Extraocular movements intact.     Conjunctiva/sclera: Conjunctivae normal.     Pupils: Pupils are equal, round, and reactive to  light.  Neck:     Thyroid: No thyroid mass, thyromegaly or thyroid tenderness.     Vascular: No carotid bruit.     Trachea: Trachea normal. No tracheal tenderness.  Cardiovascular:     Rate and Rhythm: Normal rate and regular rhythm.     Pulses: Normal pulses.          Carotid pulses are 2+ on the right side and 2+ on the left side.      Radial pulses are  2+ on the right side and 2+ on the left side.       Femoral pulses are 2+ on the right side and 2+ on the left side.      Popliteal pulses are 2+ on the right side and 2+ on the left side.       Dorsalis pedis pulses are 2+ on the right side and 2+ on the left side.       Posterior tibial pulses are 2+ on the right side and 2+ on the left side.     Heart sounds: Normal heart sounds, S1 normal and S2 normal. No murmur heard.   No friction rub. No gallop.  Pulmonary:     Effort: Pulmonary effort is normal. No respiratory distress.     Breath sounds: Normal breath sounds and air entry. No stridor. No wheezing, rhonchi or rales.  Chest:     Chest wall: No tenderness.  Abdominal:     General: Abdomen is flat. Bowel sounds are normal. There is no distension.     Palpations: Abdomen is soft. There is no mass.     Tenderness: There is no abdominal tenderness. There is no guarding or rebound.     Hernia: No hernia is present.  Genitourinary:    Comments: Exam deferred; complaints of difficulty obtaining erection, denies lack of morning erection Musculoskeletal:        General: No swelling, tenderness, deformity or signs of injury. Normal range of motion.       Arms:     Cervical back: Normal range of motion and neck supple. No rigidity or tenderness.     Right lower leg: No edema.     Left lower leg: No edema.  Lymphadenopathy:     Cervical: No cervical adenopathy.     Right cervical: No superficial, deep or posterior cervical adenopathy.    Left cervical: No superficial, deep or posterior cervical adenopathy.  Skin:    General: Skin  is warm and dry.     Capillary Refill: Capillary refill takes less than 2 seconds.     Coloration: Skin is not jaundiced or pale.     Findings: No bruising, erythema, lesion or rash.  Neurological:     General: No focal deficit present.     Mental Status: He is alert and oriented to person, place, and time. Mental status is at baseline.     GCS: GCS eye subscore is 4. GCS verbal subscore is 5. GCS motor subscore is 6.     Sensory: Sensation is intact. No sensory deficit.     Motor: Motor function is intact. No weakness.     Coordination: Coordination is intact.     Gait: Gait is intact.  Psychiatric:        Attention and Perception: Attention and perception normal.        Mood and Affect: Mood and affect normal.        Speech: Speech normal.        Behavior: Behavior normal. Behavior is cooperative.        Thought Content: Thought content normal.        Cognition and Memory: Cognition normal.        Judgment: Judgment normal.     Last depression screening scores PHQ 2/9 Scores 09/15/2021 06/28/2017  PHQ - 2 Score 0 0  PHQ- 9 Score 1 -   Last fall risk screening No flowsheet data found. Last Audit-C alcohol use screening Alcohol Use Disorder Test (AUDIT)  09/15/2021  1. How often do you have a drink containing alcohol? 3  2. How many drinks containing alcohol do you have on a typical day when you are drinking? 0  3. How often do you have six or more drinks on one occasion? 1  AUDIT-C Score 4  Some encounter information is confidential and restricted. Go to Review Flowsheets activity to see all data.   A score of 3 or more in women, and 4 or more in men indicates increased risk for alcohol abuse, EXCEPT if all of the points are from question 1   No results found for any visits on 09/15/21.  Assessment & Plan    Routine Health Maintenance and Physical Exam  Exercise Activities and Dietary recommendations  Goals   None     Immunization History  Administered Date(s)  Administered   Influenza,inj,Quad PF,6+ Mos 07/20/2016    Health Maintenance  Topic Date Due   COVID-19 Vaccine (1) Never done   Pneumococcal Vaccine 41-39 Years old (1 - PCV) Never done   HIV Screening  Never done   TETANUS/TDAP  Never done   Zoster Vaccines- Shingrix (1 of 2) Never done   INFLUENZA VACCINE  06/02/2021   COLONOSCOPY (Pts 45-46yrs Insurance coverage will need to be confirmed)  01/12/2024   Hepatitis C Screening  Completed   HPV VACCINES  Aged Out    Discussed health benefits of physical activity, and encouraged him to engage in regular exercise appropriate for his age and condition.  Problem List Items Addressed This Visit       Respiratory   Chronic frontal sinusitis    Chronic, acute exacerbation Lots of hiking Advised to try to switch out allergy for claritin/zyrtec Continue flonase BID until s/s resolve Short burst of prednisone to assist with fluid retention       Relevant Medications   predniSONE (DELTASONE) 20 MG tablet     Musculoskeletal and Integument   Dupuytren's contracture of both hands    Chronic; manageable Due for injections in bilateral hands Mainly affecting 4/5th finger Followed by specialist at [facility] Does not impair ADLs        Other   Bipolar I disorder, most recent episode (or current) manic (HCC)    Chronic, stable Followed by psych Medication caused weight gain Pt working on being more active Weight has been variable between 160-200 over past 5 years      Acute bilateral low back pain without sciatica    Suggest changing foot wear q 6 months Ex fleet feet for supportive foot wear based on foot shape/size and use of shoe Recommend 800 ibupropfen q8 hrs for 3 days max Refer to ortho if needed      Relevant Medications   predniSONE (DELTASONE) 20 MG tablet   Screening for prostate cancer    Complaints of nocturia screening      Relevant Orders   PSA   Prediabetes    Down 15 lbs since 8/22 Purposeful  weight loss Walking/hiking 30 miles/5-6 days/week repeat      Relevant Orders   Hemoglobin A1c   Annual physical exam - Primary    UTD on dental and eye exams Things to do to keep yourself healthy  - Exercise at least 30-45 minutes a day, 3-4 days a week.  - Eat a low-fat diet with lots of fruits and vegetables, up to 7-9 servings per day.  - Seatbelts can save your life. Wear them always.  - Smoke detectors on  every level of your home, check batteries every year.  - Eye Doctor - have an eye exam every 1-2 years  - Safe sex - if you may be exposed to STDs, use a condom.  - Alcohol -  If you drink, do it moderately, less than 2 drinks per day.  - [place]. Choose someone to speak for you if you are not able.  - Depression is common in our stressful world.If you're feeling down or losing interest in things you normally enjoy, please come in for a visit.  - Violence - If anyone is threatening or hurting you, please call immediately.        Need for influenza vaccination    provided      Relevant Orders   Flu Vaccine QUAD 84mo+IM (Fluarix, Fluzone & Alfiuria Quad PF)   Benign prostatic hyperplasia with nocturia    Trial of flomax      Relevant Medications   tamsulosin (FLOMAX) 0.4 MG CAPS capsule   Neck pain    Recommend ergonomics of positioning for typing to assist with upper neck pain      Benign prostatic hyperplasia (BPH) with urinary urgency    Exam deferred Will try flomax to determine if s/s improve Discussed decrease in use of stimulants ex coffee        Return in about 3 months (around 12/16/2021) for chonic disease management.     Vonna Kotyk, FNP, have reviewed all documentation for this visit. The documentation on 09/15/21 for the exam, diagnosis, procedures, and orders are all accurate and complete.    Gwyneth Sprout, [place] [phone] (phone) [phone] (fax)  [place]

## 2021-09-15 NOTE — Assessment & Plan Note (Signed)
Chronic, acute exacerbation Lots of hiking Advised to try to switch out allergy for claritin/zyrtec Continue flonase BID until s/s resolve Short burst of prednisone to assist with fluid retention

## 2021-09-16 LAB — PSA: Prostate Specific Ag, Serum: 2.3 ng/mL (ref 0.0–4.0)

## 2021-09-16 LAB — HEMOGLOBIN A1C
Est. average glucose Bld gHb Est-mCnc: 117 mg/dL
Hgb A1c MFr Bld: 5.7 % — ABNORMAL HIGH (ref 4.8–5.6)

## 2021-10-17 ENCOUNTER — Encounter: Payer: Self-pay | Admitting: Psychiatry

## 2021-10-17 ENCOUNTER — Telehealth (INDEPENDENT_AMBULATORY_CARE_PROVIDER_SITE_OTHER): Payer: 59 | Admitting: Psychiatry

## 2021-10-17 ENCOUNTER — Other Ambulatory Visit: Payer: Self-pay

## 2021-10-17 DIAGNOSIS — F3177 Bipolar disorder, in partial remission, most recent episode mixed: Secondary | ICD-10-CM

## 2021-10-17 MED ORDER — DIVALPROEX SODIUM ER 500 MG PO TB24: ORAL_TABLET | ORAL | 2 refills | Status: DC

## 2021-10-17 NOTE — Progress Notes (Signed)
Derrick Romero [id number] 1961-01-28 60 y.o.  Virtual Visit via Video Note  I connected with pt @ on 10/17/21 at  9:30 AM EST by a video enabled telemedicine application and verified that I am speaking with the correct person using two identifiers.   I discussed the limitations of evaluation and management by telemedicine and the availability of in person appointments. The patient expressed understanding and agreed to proceed.  I discussed the assessment and treatment plan with the patient. The patient was provided an opportunity to ask questions and all were answered. The patient agreed with the plan and demonstrated an understanding of the instructions.   The patient was advised to call back or seek an in-person evaluation if the symptoms worsen or if the condition fails to improve as anticipated.  I provided 25 minutes of non-face-to-face time during this encounter.  The patient was located at home.  The provider was located at [place].   Thayer Headings, PMHNP   Subjective:   Patient ID:  Derrick Romero is a 60 y.o. (DOB 12/06/1960) male.  Chief Complaint:  Chief Complaint  Patient presents with   Follow-up    Bipolar D/O    HPI OWYNN MOSQUEDA presents for follow-up of Bipolar D/O. He reports that his mood has been "good, actually." Denies depressed mood. Denies any impulsivity, risky behavior, or elevated mood. He reports that he has a new job and has been working for about 2 months. He reports that he started structuring his days in late August to schedule time to search for jobs and incorporate exercise. He has continued to exercise regularly and has lost weight. Walking 15-20 miles a week. Reports improved energy. Motivation has been good. He reports that sleep as been an issue. He reports awakening to urinate and this has improved some with initiation of Tamsulosin. He reports that he sometimes has trouble falling back to sleep after he wakes up in the middle of  the night. He reports that concentration has not been a problem. He reports anxiety has been manageable. Denies SI.   Works Monday-Friday, usually 7 am-5 pm. He has been working remotely. He reports that in the future he may be going into the office a couple of days a week. He is doing Education administrator. He has been cleaning up their home and trying to prepare it to sell in the next 1-1.5 years. Has been hiking and kayaking.  He got 2 new kittens. Wife continues to be at her brother's house. Daughter is graduating next week with BSN. Son does c4 mapping.    Review of Systems:  Review of Systems  HENT:  Positive for postnasal drip and sinus pain.   Musculoskeletal:  Negative for gait problem.  Neurological:  Negative for tremors.  Psychiatric/Behavioral:         Please refer to HPI     Medications: I have reviewed the patient's current medications.  Current Outpatient Medications  Medication Sig Dispense Refill   Ascorbic Acid (VITAMIN C) 100 MG tablet Take 100 mg by mouth daily.     cholecalciferol (VITAMIN D3) 25 MCG (1000 UT) tablet Take 1,000 Units by mouth daily.     fexofenadine (ALLEGRA) 180 MG tablet Take 180 mg by mouth daily as needed.      fluticasone (FLONASE) 50 MCG/ACT nasal spray Place into both nostrils daily.     Glucosamine-Chondroitin (GLUCOSAMINE CHONDR COMPLEX PO) Take by mouth.     Multiple Vitamins-Minerals (ONE DAILY MENS 50+ MULTIVIT  PO) Take by mouth.     sildenafil (VIAGRA) 100 MG tablet Take 0.5-1 tablets (50-100 mg total) by mouth daily as needed for erectile dysfunction. 5 tablet 11   tamsulosin (FLOMAX) 0.4 MG CAPS capsule Take 1 capsule (0.4 mg total) by mouth daily. 30 capsule 3   albuterol (VENTOLIN HFA) 108 (90 Base) MCG/ACT inhaler Inhale 2 puffs into the lungs every 6 (six) hours as needed for wheezing or shortness of breath. 54 g 1   divalproex (DEPAKOTE ER) 500 MG 24 hr tablet TAKE 2 TABLETS(1000 MG) BY MOUTH AT BEDTIME 180 tablet 2   predniSONE  (DELTASONE) 20 MG tablet Take 1 tablet (20 mg total) by mouth daily with breakfast. (Patient not taking: Reported on 10/17/2021) 5 tablet 0   psyllium (METAMUCIL) 58.6 % packet Take 1 packet by mouth daily. (Patient not taking: Reported on 10/17/2021)     No current facility-administered medications for this visit.    Medication Side Effects: Other: Some wt gain  Allergies:  Allergies  Allergen Reactions   Oak Bark  [Quercus Robur] Other (See Comments) and Shortness Of Breath    Past Medical History:  Diagnosis Date   Allergic rhinitis    Dupuytren's disease     Family History  Problem Relation Age of Onset   Asthma Father    Melanoma Father    Cancer Maternal Grandmother    Heart disease Paternal Grandfather    ADD / ADHD Nephew     Social History   Socioeconomic History   Marital status: Married    Spouse name: Not on file   Number of children: 2   Years of education: 16   Highest education level: Not on file  Occupational History   Occupation: Facilities manager  Tobacco Use   Smoking status: Never   Smokeless tobacco: Never  Substance and Sexual Activity   Alcohol use: Yes    Alcohol/week: 6.0 standard drinks    Types: 6 Cans of beer per week    Comment: occasional   Drug use: No   Sexual activity: Yes    Partners: Female  Other Topics Concern   Not on file  Social History Narrative   Not on file   Social Determinants of Health   Financial Resource Strain: Not on file  Food Insecurity: Not on file  Transportation Needs: Not on file  Physical Activity: Not on file  Stress: Not on file  Social Connections: Not on file  Intimate Partner Violence: Not on file    Past Medical History, Surgical history, Social history, and Family history were reviewed and updated as appropriate.   Please see review of systems for further details on the patient's review from today.   Objective:   Physical Exam:  Wt 180 lb (81.6 kg)    BMI 25.83 kg/m   Physical  Exam Neurological:     Mental Status: He is alert and oriented to person, place, and time.     Cranial Nerves: No dysarthria.  Psychiatric:        Attention and Perception: Attention and perception normal.        Mood and Affect: Mood normal.        Speech: Speech normal.        Behavior: Behavior is cooperative.        Thought Content: Thought content normal. Thought content is not paranoid or delusional. Thought content does not include homicidal or suicidal ideation. Thought content does not include homicidal or suicidal plan.  Cognition and Memory: Cognition and memory normal.        Judgment: Judgment normal.     Comments: Insight intact    Lab Review:     Component Value Date/Time   NA 137 05/23/2021 0941   K 4.7 05/23/2021 0941   CL 97 05/23/2021 0941   CO2 24 05/23/2021 0941   GLUCOSE 150 (H) 05/23/2021 0941   GLUCOSE 108 (H) 01/20/2019 2237   BUN 10 05/23/2021 0941   CREATININE 0.95 05/23/2021 0941   CALCIUM 9.9 05/23/2021 0941   PROT 7.2 05/23/2021 0941   ALBUMIN 4.9 05/23/2021 0941   AST 28 05/23/2021 0941   ALT 32 05/23/2021 0941   ALKPHOS 65 05/23/2021 0941   BILITOT 0.4 05/23/2021 0941   GFRNONAA 96 10/10/2020 1021   GFRAA 111 10/10/2020 1021       Component Value Date/Time   WBC 8.1 05/23/2021 0941   WBC 9.1 01/20/2019 2237   RBC 4.88 05/23/2021 0941   RBC 4.54 01/20/2019 2237   HGB 14.9 05/23/2021 0941   HCT 44.0 05/23/2021 0941   PLT 296 05/23/2021 0941   MCV 90 05/23/2021 0941   MCH 30.5 05/23/2021 0941   MCH 30.4 01/20/2019 2237   MCHC 33.9 05/23/2021 0941   MCHC 32.9 01/20/2019 2237   RDW 12.5 05/23/2021 0941   LYMPHSABS 2.5 06/02/2019 1521   EOSABS 0.2 06/02/2019 1521   BASOSABS 0.1 06/02/2019 1521    No results found for: POCLITH, LITHIUM   Lab Results  Component Value Date   VALPROATE 42 (L) 10/10/2020     .res Assessment: Plan:    Pt seen for 25 minutes and time spent discussing recent mood s/s and the improvements he  has noticed in mood, sleep, energy, and weight with increasing exercise and establishing a routine. Reviewed lab results and discussed that there is no indication of Depakote causing increased liver enzymes or abnormalities with CBC. Discussed that Hgb A1c is improving over time with increased exercise and changes in diet.  Will continue current plan of care since target signs and symptoms are well controlled without any tolerability issues. Continue Depakote ER 1000 mg po QHS for mood stabilization.  Pt to follow-up in 9 months or sooner if clinically indicated.  Patient advised to contact office with any questions, adverse effects, or acute worsening in signs and symptoms.   Toris was seen today for follow-up.  Diagnoses and all orders for this visit:  Bipolar disorder, in partial remission, most recent episode mixed (HCC) -     divalproex (DEPAKOTE ER) 500 MG 24 hr tablet; TAKE 2 TABLETS(1000 MG) BY MOUTH AT BEDTIME     Please see After Visit Summary for patient specific instructions.  Future Appointments  Date Time Provider [place]  07/17/2022 11:00 AM Thayer Headings, PMHNP [facility] None    No orders of the defined types were placed in this encounter.     -------------------------------

## 2021-10-29 ENCOUNTER — Ambulatory Visit: Payer: 59 | Admitting: Psychiatry

## 2021-12-14 ENCOUNTER — Other Ambulatory Visit: Payer: Self-pay | Admitting: Family Medicine

## 2021-12-14 DIAGNOSIS — N401 Enlarged prostate with lower urinary tract symptoms: Secondary | ICD-10-CM

## 2021-12-14 DIAGNOSIS — R351 Nocturia: Secondary | ICD-10-CM

## 2021-12-15 NOTE — Telephone Encounter (Signed)
Requesting too soon and did not schedule 3 month follow up, please assess.  Requested Prescriptions  Pending Prescriptions Disp Refills   tamsulosin (FLOMAX) 0.4 MG CAPS capsule [Pharmacy Med Name: TAMSULOSIN 0.4MG  CAPSULES] 30 capsule 3    Sig: TAKE 1 CAPSULE(0.4 MG) BY MOUTH DAILY     Urology: Alpha-Adrenergic Blocker Passed - 12/14/2021  4:42 PM      Passed - PSA in normal range and within 360 days    Prostate Specific Ag, Serum  Date Value Ref Range Status  09/15/2021 2.3 0.0 - 4.0 ng/mL Final    Comment:    Roche ECLIA methodology. According to the American Urological Association, Serum PSA should decrease and remain at undetectable levels after radical prostatectomy. The AUA defines biochemical recurrence as an initial PSA value 0.2 ng/mL or greater followed by a subsequent confirmatory PSA value 0.2 ng/mL or greater. Values obtained with different assay methods or kits cannot be used interchangeably. Results cannot be interpreted as absolute evidence of the presence or absence of malignant disease.          Passed - Last BP in normal range    BP Readings from Last 1 Encounters:  09/15/21 135/81         Passed - Valid encounter within last 12 months    Recent Outpatient Visits          3 months ago Annual physical exam   [facility] Gwyneth Sprout, FNP   2 years ago Shortness of breath   [facility] [place], Dionne Bucy, MD   2 years ago Bipolar I disorder, most recent episode (or current) manic [facility])   [facility], Dionne Bucy, MD   2 years ago Urinary frequency   [facility] [place], [place]   3 years ago Bronchitis   [facility] [place], Dionne Bucy, MD

## 2021-12-15 NOTE — Telephone Encounter (Signed)
Requesting too early, filled for #30 12/07/21, did not schedule 3 month follow up after Nov appt. Please assess. Requested Prescriptions  Pending Prescriptions Disp Refills   tamsulosin (FLOMAX) 0.4 MG CAPS capsule [Pharmacy Med Name: TAMSULOSIN 0.4MG  CAPSULES] 30 capsule 3    Sig: TAKE 1 CAPSULE(0.4 MG) BY MOUTH DAILY     Urology: Alpha-Adrenergic Blocker Passed - 12/14/2021  4:42 PM      Passed - PSA in normal range and within 360 days    Prostate Specific Ag, Serum  Date Value Ref Range Status  09/15/2021 2.3 0.0 - 4.0 ng/mL Final    Comment:    Roche ECLIA methodology. According to the American Urological Association, Serum PSA should decrease and remain at undetectable levels after radical prostatectomy. The AUA defines biochemical recurrence as an initial PSA value 0.2 ng/mL or greater followed by a subsequent confirmatory PSA value 0.2 ng/mL or greater. Values obtained with different assay methods or kits cannot be used interchangeably. Results cannot be interpreted as absolute evidence of the presence or absence of malignant disease.           Passed - Last BP in normal range    BP Readings from Last 1 Encounters:  09/15/21 135/81          Passed - Valid encounter within last 12 months    Recent Outpatient Visits           3 months ago Annual physical exam   [facility] Gwyneth Sprout, FNP   2 years ago Shortness of breath   [facility] [place], Dionne Bucy, MD   2 years ago Bipolar I disorder, most recent episode (or current) manic [facility])   [facility], Dionne Bucy, MD   2 years ago Urinary frequency   [facility] [place], [place]   3 years ago Bronchitis   [facility] [place], Dionne Bucy, MD

## 2022-02-12 ENCOUNTER — Other Ambulatory Visit: Payer: Self-pay | Admitting: Family Medicine

## 2022-02-12 DIAGNOSIS — N401 Enlarged prostate with lower urinary tract symptoms: Secondary | ICD-10-CM

## 2022-02-13 NOTE — Telephone Encounter (Signed)
Requested Prescriptions  ?Pending Prescriptions Disp Refills  ?? tamsulosin (FLOMAX) 0.4 MG CAPS capsule [Pharmacy Med Name: TAMSULOSIN 0.'4MG'$  CAPSULES] 30 capsule 3  ?  Sig: TAKE 1 CAPSULE(0.4 MG) BY MOUTH DAILY  ?  ? Urology: Alpha-Adrenergic Blocker Passed - 02/12/2022  4:36 PM  ?  ?  Passed - PSA in normal range and within 360 days  ?  Prostate Specific Ag, Serum  ?Date Value Ref Range Status  ?09/15/2021 2.3 0.0 - 4.0 ng/mL Final  ?  Comment:  ?  Roche ECLIA methodology. ?According to the American Urological Association, Serum PSA should ?decrease and remain at undetectable levels after radical ?prostatectomy. The AUA defines biochemical recurrence as an initial ?PSA value 0.2 ng/mL or greater followed by a subsequent confirmatory ?PSA value 0.2 ng/mL or greater. ?Values obtained with different assay methods or kits cannot be used ?interchangeably. Results cannot be interpreted as absolute evidence ?of the presence or absence of malignant disease. ?  ?   ?  ?  Passed - Last BP in normal range  ?  BP Readings from Last 1 Encounters:  ?09/15/21 135/81  ?   ?  ?  Passed - Valid encounter within last 12 months  ?  Recent Outpatient Visits   ?      ? 5 months ago Annual physical exam  ? [facility] Tally Joe T, FNP  ? 2 years ago Shortness of breath  ? [facility] [place], Dionne Bucy, MD  ? 2 years ago Bipolar I disorder, most recent episode (or current) manic ([place])  ? [facility] [place], Dionne Bucy, MD  ? 3 years ago Urinary frequency  ? [place], [place]  ? 3 years ago Bronchitis  ? [facility] Bacigalupo, Dionne Bucy, MD  ?  ?  ? ?  ?  ?  ? ?

## 2022-06-12 ENCOUNTER — Other Ambulatory Visit: Payer: Self-pay | Admitting: Family Medicine

## 2022-06-12 DIAGNOSIS — N401 Enlarged prostate with lower urinary tract symptoms: Secondary | ICD-10-CM

## 2022-07-17 ENCOUNTER — Encounter: Payer: Self-pay | Admitting: Psychiatry

## 2022-07-17 ENCOUNTER — Telehealth (INDEPENDENT_AMBULATORY_CARE_PROVIDER_SITE_OTHER): Payer: 59 | Admitting: Psychiatry

## 2022-07-17 DIAGNOSIS — F3177 Bipolar disorder, in partial remission, most recent episode mixed: Secondary | ICD-10-CM

## 2022-07-17 DIAGNOSIS — R5383 Other fatigue: Secondary | ICD-10-CM | POA: Diagnosis not present

## 2022-07-17 DIAGNOSIS — Z79899 Other long term (current) drug therapy: Secondary | ICD-10-CM

## 2022-07-17 MED ORDER — DIVALPROEX SODIUM ER 500 MG PO TB24: ORAL_TABLET | ORAL | 2 refills | Status: DC

## 2022-07-17 NOTE — Progress Notes (Signed)
Derrick Romero [id number] December 10, 1960 61 y.o.  Virtual Visit via Video Note  I connected with pt @ on 07/17/22 at 11:00 AM EDT by a video enabled telemedicine application and verified that I am speaking with the correct person using two identifiers.   I discussed the limitations of evaluation and management by telemedicine and the availability of in person appointments. The patient expressed understanding and agreed to proceed.  I discussed the assessment and treatment plan with the patient. The patient was provided an opportunity to ask questions and all were answered. The patient agreed with the plan and demonstrated an understanding of the instructions.   The patient was advised to call back or seek an in-person evaluation if the symptoms worsen or if the condition fails to improve as anticipated.  I provided 23 minutes of non-face-to-face time during this encounter.  The patient was located at home.  The provider was located at [place].   Thayer Headings, PMHNP   Subjective:   Patient ID:  Derrick Romero is a 61 y.o. (DOB 1960-12-05) male.  Chief Complaint:  Chief Complaint  Patient presents with  . Follow-up    Bipolar Disorder    HPI Derrick Romero presents for follow-up of Bipolar D/O. He has been very busy at work. He started going into the office in May one day a week and now 3 days a week. Has a commute from [place] to [place]. 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   He reports that he has been doing well overall. Denies depressed mood or irritability. Denies any anxiety. He has been intentional about getting adequate sleep and has been getting 7-7.5 hours a night. Has been going to bed earlier to get up earlier for commute. Energy has been "a little bit down." He started going to the gym regularly in Feb and then jammed his knee which interfered with regular walking and hiking. He was then doing some strength training and injured  his elbow and was not able to do this. He has gained a few pounds with less activity. Denies any change in appetite or food intake. Concentration has been good and he reports that he has been productive. Denies impulsive or risky behavior. Denies SI.     Review of Systems:  Review of Systems  Gastrointestinal: Negative.   Musculoskeletal:  Negative for gait problem.       Knee pain  Neurological:  Negative for tremors.  Psychiatric/Behavioral:         Please refer to HPI    Medications: I have reviewed the patient's current medications.  Current Outpatient Medications  Medication Sig Dispense Refill  . albuterol (VENTOLIN HFA) 108 (90 Base) MCG/ACT inhaler Inhale 2 puffs into the lungs every 6 (six) hours as needed for wheezing or shortness of breath. 54 g 1  . Ascorbic Acid (VITAMIN C) 100 MG tablet Take 100 mg by mouth daily.    . cholecalciferol (VITAMIN D3) 25 MCG (1000 UT) tablet Take 1,000 Units by mouth daily.    . divalproex (DEPAKOTE ER) 500 MG 24 hr tablet TAKE 2 TABLETS(1000 MG) BY MOUTH AT BEDTIME 180 tablet 2  . fexofenadine (ALLEGRA) 180 MG tablet Take 180 mg by mouth daily as needed.     . fluticasone (FLONASE) 50 MCG/ACT nasal spray Place into both nostrils daily.    . Glucosamine-Chondroitin (GLUCOSAMINE CHONDR COMPLEX PO) Take by mouth.    . Multiple Vitamins-Minerals (ONE DAILY MENS 50+ MULTIVIT PO) Take by  mouth.    . predniSONE (DELTASONE) 20 MG tablet Take 1 tablet (20 mg total) by mouth daily with breakfast. (Patient not taking: Reported on 10/17/2021) 5 tablet 0  . psyllium (METAMUCIL) 58.6 % packet Take 1 packet by mouth daily. (Patient not taking: Reported on 10/17/2021)    . sildenafil (VIAGRA) 100 MG tablet Take 0.5-1 tablets (50-100 mg total) by mouth daily as needed for erectile dysfunction. 5 tablet 11  . tamsulosin (FLOMAX) 0.4 MG CAPS capsule TAKE 1 CAPSULE(0.4 MG) BY MOUTH DAILY 30 capsule 3   No current facility-administered medications for this  visit.    Medication Side Effects: None  Allergies:  Allergies  Allergen Reactions  . Oak Bark  [Quercus Robur] Other (See Comments) and Shortness Of Breath    Past Medical History:  Diagnosis Date  . Allergic rhinitis   . Dupuytren's disease     Family History  Problem Relation Age of Onset  . Asthma Father   . Melanoma Father   . Cancer Maternal Grandmother   . Heart disease Paternal Grandfather   . ADD / ADHD Nephew     Social History   Socioeconomic History  . Marital status: Married    Spouse name: Not on file  . Number of children: 2  . Years of education: 95  . Highest education level: Not on file  Occupational History  . Occupation: Facilities manager  Tobacco Use  . Smoking status: Never  . Smokeless tobacco: Never  Substance and Sexual Activity  . Alcohol use: Yes    Alcohol/week: 6.0 standard drinks of alcohol    Types: 6 Cans of beer per week    Comment: occasional  . Drug use: No  . Sexual activity: Yes    Partners: Female  Other Topics Concern  . Not on file  Social History Narrative  . Not on file   Social Determinants of Health   Financial Resource Strain: Not on file  Food Insecurity: Not on file  Transportation Needs: Not on file  Physical Activity: Not on file  Stress: Not on file  Social Connections: Not on file  Intimate Partner Violence: Not on file    Past Medical History, Surgical history, Social history, and Family history were reviewed and updated as appropriate.   Please see review of systems for further details on the patient's review from today.   Objective:   Physical Exam:  There were no vitals taken for this visit.  Physical Exam  Lab Review:     Component Value Date/Time   NA 137 05/23/2021 0941   K 4.7 05/23/2021 0941   CL 97 05/23/2021 0941   CO2 24 05/23/2021 0941   GLUCOSE 150 (H) 05/23/2021 0941   GLUCOSE 108 (H) 01/20/2019 2237   BUN 10 05/23/2021 0941   CREATININE 0.95 05/23/2021 0941    CALCIUM 9.9 05/23/2021 0941   PROT 7.2 05/23/2021 0941   ALBUMIN 4.9 05/23/2021 0941   AST 28 05/23/2021 0941   ALT 32 05/23/2021 0941   ALKPHOS 65 05/23/2021 0941   BILITOT 0.4 05/23/2021 0941   GFRNONAA 96 10/10/2020 1021   GFRAA 111 10/10/2020 1021       Component Value Date/Time   WBC 8.1 05/23/2021 0941   WBC 9.1 01/20/2019 2237   RBC 4.88 05/23/2021 0941   RBC 4.54 01/20/2019 2237   HGB 14.9 05/23/2021 0941   HCT 44.0 05/23/2021 0941   PLT 296 05/23/2021 0941   MCV 90 05/23/2021 0941  MCH 30.5 05/23/2021 0941   MCH 30.4 01/20/2019 2237   MCHC 33.9 05/23/2021 0941   MCHC 32.9 01/20/2019 2237   RDW 12.5 05/23/2021 0941   LYMPHSABS 2.5 06/02/2019 1521   EOSABS 0.2 06/02/2019 1521   BASOSABS 0.1 06/02/2019 1521    No results found for: "POCLITH", "LITHIUM"   Lab Results  Component Value Date   VALPROATE 42 (L) 10/10/2020     .res Assessment: Plan:    There are no diagnoses linked to this encounter.   Please see After Visit Summary for patient specific instructions.  No future appointments.   No orders of the defined types were placed in this encounter.     -------------------------------

## 2022-09-13 ENCOUNTER — Other Ambulatory Visit: Payer: Self-pay | Admitting: Family Medicine

## 2022-09-13 DIAGNOSIS — N401 Enlarged prostate with lower urinary tract symptoms: Secondary | ICD-10-CM

## 2022-11-27 ENCOUNTER — Other Ambulatory Visit: Payer: Self-pay | Admitting: Family Medicine

## 2022-11-27 NOTE — Telephone Encounter (Signed)
Requested medications are due for refill today.  yes  Requested medications are on the active medications list.  yes  Last refill. 09/15/2021 #5 11 rf  Future visit scheduled.   yes  Notes to clinic.  Labs are expired    Requested Prescriptions  Pending Prescriptions Disp Refills   sildenafil (VIAGRA) 100 MG tablet [Pharmacy Med Name: SILDENAFIL '100MG'$  TABLETS] 5 tablet 11    Sig: TAKE 1/2 TO 1 TABLET(50 TO 100 MG) BY MOUTH DAILY AS NEEDED FOR ERECTILE DYSFUNCTION     Urology: Erectile Dysfunction Agents Failed - 11/27/2022  4:59 PM      Failed - AST in normal range and within 360 days    AST  Date Value Ref Range Status  05/23/2021 28 0 - 40 IU/L Final         Failed - ALT in normal range and within 360 days    ALT  Date Value Ref Range Status  05/23/2021 32 0 - 44 IU/L Final         Failed - Valid encounter within last 12 months    Recent Outpatient Visits           1 year ago Annual physical exam   [place], Elise T, [place]   3 years ago Shortness of breath   [place], Dionne Bucy, MD   3 years ago Bipolar I disorder, most recent episode (or current) manic [facility])   [place], Dionne Bucy, MD   3 years ago Urinary frequency   [place], [place]   4 years ago [place], Dionne Bucy, MD       Future Appointments             In 1 week Bacigalupo, Dionne Bucy, MD [facility]            Passed - Last BP in normal range    BP Readings from Last 1 Encounters:  09/15/21 135/81

## 2022-11-27 NOTE — Telephone Encounter (Signed)
Called pt - made upcoming appt.

## 2022-12-04 NOTE — Progress Notes (Unsigned)
I,Loyd Marhefka S Julieth Tugman,acting as a Education administrator for Lavon Paganini, MD.,have documented all relevant documentation on the behalf of Lavon Paganini, MD,as directed by  Lavon Paganini, MD while in the presence of Lavon Paganini, MD.    Complete physical exam   Patient: Derrick Romero   DOB: 02/09/1961   62 y.o. Male  MRN: [id number] Visit Date: 12/07/2022  Today's healthcare provider: Lavon Paganini, MD   Chief Complaint  Patient presents with   Annual Exam   Subjective    Derrick Romero is a 62 y.o. male who presents today for a complete physical exam.  He reports consuming a general diet. Home exercise routine includes hiking and walking. He generally feels fairly well. He reports sleeping fairly well. He does have additional problems to discuss today.  HPI   Medications seem less effective for BPH - dad also has this problem. Urination interferes with sleep. Not drinking anything after 7pm. Needing advil PM to avoid nocturia. +hesitancy, urinary frequency/urgency, dribbling. Having to urinate about hourly.  Past Medical History:  Diagnosis Date   Allergic rhinitis    Dupuytren's disease    Past Surgical History:  Procedure Laterality Date   COLONOSCOPY WITH PROPOFOL N/A 01/12/2019   Procedure: COLONOSCOPY WITH PROPOFOL;  Surgeon: Virgel Manifold, MD;  Location: [facility] ENDOSCOPY;  Service: Endoscopy;  Laterality: N/A;   ETHMOIDECTOMY     KNEE ARTHROSCOPY     Social History   Socioeconomic History   Marital status: Married    Spouse name: Not on file   Number of children: 2   Years of education: 16   Highest education level: Not on file  Occupational History   Occupation: Facilities manager  Tobacco Use   Smoking status: Never   Smokeless tobacco: Never  Substance and Sexual Activity   Alcohol use: Yes    Alcohol/week: 6.0 standard drinks of alcohol    Types: 6 Cans of beer per week    Comment: occasional   Drug use: No   Sexual activity: Yes     Partners: Female  Other Topics Concern   Not on file  Social History Narrative   Not on file   Social Determinants of Health   Financial Resource Strain: Not on file  Food Insecurity: Not on file  Transportation Needs: Not on file  Physical Activity: Not on file  Stress: Not on file  Social Connections: Not on file  Intimate Partner Violence: Not on file   Family Status  Relation Name Status   Mother  Alive   Father  Alive   Brother  Alive   Daughter  Alive   Son  Alive   MGM  Deceased   MGF  Deceased   PGM  Deceased   PGF  Deceased   Brother  Passenger transport manager  (Not Specified)   Family History  Problem Relation Age of Onset   Asthma Father    Melanoma Father    Cancer Maternal Grandmother    Heart disease Paternal Grandfather    ADD / ADHD Nephew    Allergies  Allergen Reactions   Oak Bark  [Quercus Robur] Other (See Comments) and Shortness Of Breath    Patient Care Team: Virginia Crews, MD as PCP - General (Family Medicine)   Medications: Outpatient Medications Prior to Visit  Medication Sig   albuterol (VENTOLIN HFA) 108 (90 Base) MCG/ACT inhaler Inhale 2 puffs into the lungs every 6 (six) hours as needed for wheezing or shortness  of breath.   Ascorbic Acid (VITAMIN C) 100 MG tablet Take 100 mg by mouth daily.   cholecalciferol (VITAMIN D3) 25 MCG (1000 UT) tablet Take 1,000 Units by mouth daily.   divalproex (DEPAKOTE ER) 500 MG 24 hr tablet TAKE 2 TABLETS(1000 MG) BY MOUTH AT BEDTIME   fexofenadine (ALLEGRA) 180 MG tablet Take 180 mg by mouth daily as needed.    Glucosamine-Chondroitin (GLUCOSAMINE CHONDR COMPLEX PO) Take by mouth.   Multiple Vitamins-Minerals (ONE DAILY MENS 50+ MULTIVIT PO) Take by mouth.   psyllium (METAMUCIL) 58.6 % packet Take 1 packet by mouth daily.   [DISCONTINUED] sildenafil (VIAGRA) 100 MG tablet TAKE 1/2 TO 1 TABLET(50 TO 100 MG) BY MOUTH DAILY AS NEEDED FOR ERECTILE DYSFUNCTION   [DISCONTINUED] tamsulosin (FLOMAX) 0.4 MG  CAPS capsule TAKE 1 CAPSULE(0.4 MG) BY MOUTH DAILY   No facility-administered medications prior to visit.    Review of Systems  All other systems reviewed and are negative.     Objective    BP (!) 145/80   Pulse 71   Temp 98.2 F (36.8 C) (Temporal)   Resp 16   Ht '5\' 10"'$  (1.778 m)   Wt 195 lb 3.2 oz (88.5 kg)   SpO2 100%   BMI 28.01 kg/m     Physical Exam Vitals reviewed.  Constitutional:      General: He is not in acute distress.    Appearance: Normal appearance. He is well-developed. He is not diaphoretic.  HENT:     Head: Normocephalic and atraumatic.     Right Ear: Tympanic membrane, ear canal and external ear normal.     Left Ear: Tympanic membrane, ear canal and external ear normal.     Nose: Nose normal.     Mouth/Throat:     Mouth: Mucous membranes are moist.     Pharynx: Oropharynx is clear. No oropharyngeal exudate.  Eyes:     General: No scleral icterus.    Conjunctiva/sclera: Conjunctivae normal.     Pupils: Pupils are equal, round, and reactive to light.  Neck:     Thyroid: No thyromegaly.  Cardiovascular:     Rate and Rhythm: Normal rate and regular rhythm.     Pulses: Normal pulses.     Heart sounds: Normal heart sounds. No murmur heard. Pulmonary:     Effort: Pulmonary effort is normal. No respiratory distress.     Breath sounds: Normal breath sounds. No wheezing or rales.  Abdominal:     General: There is no distension.     Palpations: Abdomen is soft.     Tenderness: There is no abdominal tenderness.  Musculoskeletal:        General: No deformity.     Cervical back: Neck supple.     Right lower leg: No edema.     Left lower leg: No edema.  Lymphadenopathy:     Cervical: No cervical adenopathy.  Skin:    General: Skin is warm and dry.     Findings: No rash.  Neurological:     Mental Status: He is alert and oriented to person, place, and time. Mental status is at baseline.     Gait: Gait normal.  Psychiatric:        Mood and Affect:  Mood normal.        Behavior: Behavior normal.        Thought Content: Thought content normal.      Last depression screening scores    09/15/2021   10:35 AM 06/28/2017  8:09 AM  PHQ 2/9 Scores  PHQ - 2 Score 0 0  PHQ- 9 Score 1    Last fall risk screening    12/07/2022    9:01 AM  [place] in the past year? 0  Number falls in past yr: 0  Injury with Fall? 0  Risk for fall due to : No Fall Risks  Follow up Falls evaluation completed   Last Audit-C alcohol use screening    09/15/2021   10:35 AM  Alcohol Use Disorder Test (AUDIT)  1. How often do you have a drink containing alcohol? 3  2. How many drinks containing alcohol do you have on a typical day when you are drinking? 0  3. How often do you have six or more drinks on one occasion? 1  AUDIT-C Score 4   A score of 3 or more in women, and 4 or more in men indicates increased risk for alcohol abuse, EXCEPT if all of the points are from question 1   No results found for any visits on 12/07/22.  Assessment & Plan    Routine Health Maintenance and Physical Exam  Exercise Activities and Dietary recommendations  Goals   None     Immunization History  Administered Date(s) Administered   Covid-19,MRNA Vaccine(Spikevax)57mo. thru 183yr10/20/2023   Influenza,inj,Quad PF,6+ Mos 07/20/2016, 09/15/2021   Influenza-Unspecified 08/21/2022   Tdap 12/07/2022   Zoster Recombinat (Shingrix) 12/07/2022    Health Maintenance  Topic Date Due   COVID-19 Vaccine (1) 08/21/2022   Zoster Vaccines- Shingrix (2 of 2) 02/01/2023   COLONOSCOPY (Pts [id number] coverage will need to be confirmed)  01/12/2024   DTaP/Tdap/Td (2 - Td or Tdap) 12/07/2032   INFLUENZA VACCINE  Completed   Hepatitis C Screening  Completed   HIV Screening  Completed   HPV VACCINES  Aged Out    Discussed health benefits of physical activity, and encouraged him to engage in regular exercise appropriate for his age and  condition.  Problem List Items Addressed This Visit       Other   Prediabetes    Recommend low carb diet Recheck A1c       Relevant Orders   Hemoglobin A1c   Benign prostatic hyperplasia with nocturia    Uncontrolled Still symptomatic Increase flomax to 10 mg daily If not controlled, also consider addition of cialis '10mg'$  daily to flomax if not well controlled in 1-2 months      Relevant Orders   PSA Total (Reflex To Free)   Other Visit Diagnoses     Encounter for annual physical exam    -  Primary   Relevant Orders   HIV antibody (with reflex)   Hemoglobin A1c   Lipid panel   Comprehensive metabolic panel   PSA Total (Reflex To Free)   CBC   Valproic Acid level   TSH   Need for Tdap vaccination       Relevant Orders   Tdap vaccine greater than or equal to 7yo IM (Completed)   Need for shingles vaccine       Relevant Orders   Varicella-zoster vaccine IM (Completed)   Screening for prostate cancer       Relevant Orders   PSA Total (Reflex To Free)   Encounter for screening for HIV       Relevant Orders   HIV antibody (with reflex)   Long-term use of high-risk medication       Relevant Orders  CBC   Valproic Acid level        Return in about 1 year (around 12/08/2023) for CPE and 2-6 months for shingrix #2.     I, Lavon Paganini, MD, have reviewed all documentation for this visit. The documentation on 12/07/22 for the exam, diagnosis, procedures, and orders are all accurate and complete.   Bacigalupo, Dionne Bucy, MD, MPH [facility]

## 2022-12-07 ENCOUNTER — Encounter: Payer: Self-pay | Admitting: Family Medicine

## 2022-12-07 ENCOUNTER — Ambulatory Visit (INDEPENDENT_AMBULATORY_CARE_PROVIDER_SITE_OTHER): Payer: 59 | Admitting: Family Medicine

## 2022-12-07 VITALS — BP 145/80 | HR 71 | Temp 98.2°F | Resp 16 | Ht 70.0 in | Wt 195.2 lb

## 2022-12-07 DIAGNOSIS — Z114 Encounter for screening for human immunodeficiency virus [HIV]: Secondary | ICD-10-CM

## 2022-12-07 DIAGNOSIS — Z0000 Encounter for general adult medical examination without abnormal findings: Secondary | ICD-10-CM

## 2022-12-07 DIAGNOSIS — R7303 Prediabetes: Secondary | ICD-10-CM

## 2022-12-07 DIAGNOSIS — Z125 Encounter for screening for malignant neoplasm of prostate: Secondary | ICD-10-CM

## 2022-12-07 DIAGNOSIS — Z23 Encounter for immunization: Secondary | ICD-10-CM | POA: Diagnosis not present

## 2022-12-07 DIAGNOSIS — R351 Nocturia: Secondary | ICD-10-CM | POA: Diagnosis not present

## 2022-12-07 DIAGNOSIS — N401 Enlarged prostate with lower urinary tract symptoms: Secondary | ICD-10-CM | POA: Diagnosis not present

## 2022-12-07 DIAGNOSIS — Z79899 Other long term (current) drug therapy: Secondary | ICD-10-CM | POA: Diagnosis not present

## 2022-12-07 MED ORDER — TAMSULOSIN HCL 0.4 MG PO CAPS: 0.8000 mg | ORAL_CAPSULE | Freq: Every day | ORAL | 1 refills | Status: DC

## 2022-12-07 MED ORDER — SILDENAFIL CITRATE 100 MG PO TABS: 50.0000 mg | ORAL_TABLET | ORAL | 11 refills | Status: DC | PRN

## 2022-12-07 NOTE — Assessment & Plan Note (Signed)
Recommend low carb diet °Recheck A1c  °

## 2022-12-07 NOTE — Assessment & Plan Note (Signed)
Uncontrolled Still symptomatic Increase flomax to 10 mg daily If not controlled, also consider addition of cialis '10mg'$  daily to flomax if not well controlled in 1-2 months

## 2022-12-17 LAB — HIV ANTIBODY (ROUTINE TESTING W REFLEX): HIV Screen 4th Generation wRfx: NONREACTIVE

## 2022-12-17 LAB — COMPREHENSIVE METABOLIC PANEL
ALT: 34 IU/L (ref 0–44)
AST: 30 IU/L (ref 0–40)
Albumin/Globulin Ratio: 1.6 (ref 1.2–2.2)
Albumin: 4.6 g/dL (ref 3.8–4.9)
Alkaline Phosphatase: 68 IU/L (ref 44–121)
BUN/Creatinine Ratio: 13 (ref 9–20)
BUN: 12 mg/dL (ref 6–24)
Bilirubin Total: 0.3 mg/dL (ref 0.0–1.2)
CO2: 21 mmol/L (ref 20–29)
Calcium: 9.8 mg/dL (ref 8.7–10.2)
Chloride: 96 mmol/L (ref 96–106)
Creatinine, Ser: 0.94 mg/dL (ref 0.76–1.27)
Globulin, Total: 2.9 g/dL (ref 1.5–4.5)
Glucose: 114 mg/dL — ABNORMAL HIGH (ref 70–99)
Potassium: 4.5 mmol/L (ref 3.5–5.2)
Sodium: 134 mmol/L (ref 134–144)
Total Protein: 7.5 g/dL (ref 6.0–8.5)
eGFR: 93 mL/min/{1.73_m2} (ref >59)

## 2022-12-17 LAB — LIPID PANEL
Chol/HDL Ratio: 4.2 ratio (ref 0.0–5.0)
Cholesterol, Total: 226 mg/dL — ABNORMAL HIGH (ref 100–199)
HDL: 54 mg/dL (ref >39)
LDL Chol Calc (NIH): 151 mg/dL — ABNORMAL HIGH (ref 0–99)
Triglycerides: 120 mg/dL (ref 0–149)
VLDL Cholesterol Cal: 21 mg/dL (ref 5–40)

## 2022-12-17 LAB — HEMOGLOBIN A1C
Est. average glucose Bld gHb Est-mCnc: 123 mg/dL
Hgb A1c MFr Bld: 5.9 % — ABNORMAL HIGH (ref 4.8–5.6)

## 2022-12-17 LAB — CBC
Hematocrit: 42.4 % (ref 37.5–51.0)
Hemoglobin: 14 g/dL (ref 13.0–17.7)
MCH: 30.6 pg (ref 26.6–33.0)
MCHC: 33 g/dL (ref 31.5–35.7)
MCV: 93 fL (ref 79–97)
Platelets: 330 10*3/uL (ref 150–450)
RBC: 4.57 x10E6/uL (ref 4.14–5.80)
RDW: 12.2 % (ref 11.6–15.4)
WBC: 7.8 10*3/uL (ref 3.4–10.8)

## 2022-12-17 LAB — PSA TOTAL (REFLEX TO FREE): Prostate Specific Ag, Serum: 1.3 ng/mL (ref 0.0–4.0)

## 2022-12-17 LAB — TSH: TSH: 1.71 u[IU]/mL (ref 0.450–4.500)

## 2022-12-17 LAB — VALPROIC ACID LEVEL: Valproic Acid Lvl: 18 ug/mL — ABNORMAL LOW (ref 50–100)

## 2022-12-18 ENCOUNTER — Telehealth: Payer: Self-pay | Admitting: Psychiatry

## 2022-12-18 NOTE — Telephone Encounter (Signed)
Patient called back. He said he was fasting for the labs. He said the mornings are stressful for him and so he takes all his meds in the morning. The Rx for divalproex says to take QHS. He said he would start taking that way now, but wonders if that would have affected his lab values.

## 2022-12-18 NOTE — Telephone Encounter (Signed)
Called and left message for pt that level was lower compared to past levels and that level may appear lower than actual blood level if it was drawn later in the day or if dose the night before was missed, however it appears that blood level was drawn in the morning. Discussed considering dose increase if he is noticing medication is not fully controlling his symptoms and otherwise would continue current dose and monitoring for breakthrough symptoms. Advised pt to call office with any questions.

## 2022-12-18 NOTE — Telephone Encounter (Signed)
Patient had labs and is concerned about low valproic acid. LVM that after you have a chance to view the results we can let him know recommendations.

## 2022-12-18 NOTE — Telephone Encounter (Signed)
Derrick Romero called at 9:25 wanting to discuss labs.  He was told that his Valproic Acid levels are low.  Please call to discuss.

## 2023-01-26 ENCOUNTER — Encounter: Payer: Self-pay | Admitting: Physician Assistant

## 2023-01-26 ENCOUNTER — Other Ambulatory Visit: Payer: Self-pay | Admitting: Physician Assistant

## 2023-01-26 ENCOUNTER — Ambulatory Visit: Payer: 59 | Admitting: Physician Assistant

## 2023-01-26 VITALS — BP 133/80 | HR 64 | Temp 98.3°F | Resp 12

## 2023-01-26 DIAGNOSIS — J029 Acute pharyngitis, unspecified: Secondary | ICD-10-CM

## 2023-01-26 DIAGNOSIS — J0110 Acute frontal sinusitis, unspecified: Secondary | ICD-10-CM

## 2023-01-26 LAB — POC COVID19 BINAXNOW: SARS Coronavirus 2 Ag: NEGATIVE

## 2023-01-26 LAB — POCT RAPID STREP A (OFFICE): Rapid Strep A Screen: NEGATIVE

## 2023-01-26 MED ORDER — AMOXICILLIN 875 MG PO TABS: 875.0000 mg | ORAL_TABLET | Freq: Two times a day (BID) | ORAL | 0 refills | Status: AC

## 2023-01-26 MED ORDER — AZELASTINE HCL 0.1 % NA SOLN: 1.0000 | Freq: Two times a day (BID) | NASAL | 1 refills | Status: AC

## 2023-01-26 NOTE — Progress Notes (Signed)
I,Sha'taria Tyson,acting as a Education administrator for [employer], PA-C.,have documented all relevant documentation on the behalf of Mikey Kirschner, PA-C,as directed by  Mikey Kirschner, PA-C while in the presence of Mikey Kirschner, PA-C.   Established patient visit   Patient: Derrick Romero   DOB: [birth year]   62 y.o. Male  MRN: [id number] Visit Date: 01/26/2023  Today's healthcare provider: Mikey Kirschner, PA-C   Cc. Sore throat, rhinorrhea, cough x 6 weeks worsening last night  Subjective    HPI   Pt reports x  6 weeks of runny nose, cough, ears feel clogged. Last night pt reports a sore throat. Hot drinks help. Denies strep contact. Denies fevers.  Medications: Outpatient Medications Prior to Visit  Medication Sig   albuterol (VENTOLIN HFA) 108 (90 Base) MCG/ACT inhaler Inhale 2 puffs into the lungs every 6 (six) hours as needed for wheezing or shortness of breath.   Ascorbic Acid (VITAMIN C) 100 MG tablet Take 100 mg by mouth daily.   cholecalciferol (VITAMIN D3) 25 MCG (1000 UT) tablet Take 1,000 Units by mouth daily.   divalproex (DEPAKOTE ER) 500 MG 24 hr tablet TAKE 2 TABLETS(1000 MG) BY MOUTH AT BEDTIME   fexofenadine (ALLEGRA) 180 MG tablet Take 180 mg by mouth daily as needed.    Glucosamine-Chondroitin (GLUCOSAMINE CHONDR COMPLEX PO) Take by mouth.   Multiple Vitamins-Minerals (ONE DAILY MENS 50+ MULTIVIT PO) Take by mouth.   psyllium (METAMUCIL) 58.6 % packet Take 1 packet by mouth daily.   sildenafil (VIAGRA) 100 MG tablet Take 0.5-1 tablets (50-100 mg total) by mouth as needed for erectile dysfunction.   tamsulosin (FLOMAX) 0.4 MG CAPS capsule Take 2 capsules (0.8 mg total) by mouth daily.   No facility-administered medications prior to visit.    Review of Systems  Constitutional:  Positive for fatigue. Negative for fever.  HENT:  Positive for congestion, postnasal drip, rhinorrhea, sinus pressure and sore throat.   Respiratory:  Positive for cough. Negative for  shortness of breath.   Cardiovascular:  Negative for chest pain, palpitations and leg swelling.  Neurological:  Negative for dizziness and headaches.       Objective    BP 133/80   Pulse 64   Temp 98.3 F (36.8 C)   Resp 12   SpO2 100%   Physical Exam Constitutional:      General: He is awake.     Appearance: He is well-developed.  HENT:     Head: Normocephalic.     Right Ear: There is impacted cerumen.     Left Ear: There is impacted cerumen.     Nose: Rhinorrhea present.     Mouth/Throat:     Pharynx: Posterior oropharyngeal erythema present. No oropharyngeal exudate.  Eyes:     Conjunctiva/sclera: Conjunctivae normal.  Cardiovascular:     Rate and Rhythm: Normal rate and regular rhythm.     Heart sounds: Normal heart sounds.  Pulmonary:     Effort: Pulmonary effort is normal.     Breath sounds: Normal breath sounds.  Skin:    General: Skin is warm.  Neurological:     Mental Status: He is alert and oriented to person, place, and time.  Psychiatric:        Attention and Perception: Attention normal.        Mood and Affect: Mood normal.        Speech: Speech normal.        Behavior: Behavior is cooperative.  No results found for any visits on 01/26/23.  Assessment & Plan     Acute sinusitis Poc covid, strep negative. Fluids, antihistamine, saline nasal rinses Rx amoxicillin 875 mg bid x 7 days Rx azelastine nasal spray  Return if symptoms worsen or fail to improve.      I, Mikey Kirschner, PA-C have reviewed all documentation for this visit. The documentation on  01/26/23  for the exam, diagnosis, procedures, and orders are all accurate and complete.  Mikey Kirschner, PA-C [facility] [address] [place], [place], [address] Office: [phone] Fax: [place]

## 2023-01-27 NOTE — Telephone Encounter (Signed)
Rx already sent in. LVMTCB to pt.

## 2023-01-29 ENCOUNTER — Ambulatory Visit: Payer: Self-pay | Admitting: *Deleted

## 2023-01-29 ENCOUNTER — Telehealth: Payer: Self-pay

## 2023-01-29 ENCOUNTER — Other Ambulatory Visit: Payer: Self-pay | Admitting: Physician Assistant

## 2023-01-29 MED ORDER — PREDNISONE 10 MG PO TABS: 10.0000 mg | ORAL_TABLET | Freq: Every day | ORAL | 0 refills | Status: AC

## 2023-01-29 NOTE — Telephone Encounter (Signed)
Copied from [place] [id number]. Topic: General - Other >> Jan 29, 2023  1:37 PM Derrick Romero wrote: Reason for CRM: The patient has called to request additional lab orders for testing their sore throat that they have been previously seen for on 01/26/23  Please contact the patient further when possible

## 2023-01-29 NOTE — Telephone Encounter (Signed)
This has already been completed. 

## 2023-01-29 NOTE — Telephone Encounter (Signed)
Pt states that he has a raging sore throat. Was seen 01/26/23 And a rapid strept test was done. azelastine (ASTELIN) 0.1 % nasal spray [id number] & amoxicillin (AMOXIL) 875 MG tablet [id number] was prescribed. However, still not feeling better.  Pts wife works for [employer] is near the patient's home. Can an order be placed for whatever testing think is best and then further medication be sent in based on results. Pt would like to feel better soon. Please advise [phone]

## 2023-01-29 NOTE — Telephone Encounter (Signed)
Advised 

## 2023-01-29 NOTE — Telephone Encounter (Signed)
  Chief Complaint: patient is taking antibiotic- he states his symptoms are not improving- he would like orders for lab tests- strep- and whatever else provider feels may be appropriate- he has [employer] insurance and testing is free Symptoms: sore throat, increased sinus pressure- requesting further testing Frequency: was seen 01/26/23- rapid testing negative Pertinent Negatives: Patient denies fever Disposition: [] ED /[] Urgent Care (no appt availability in office) / [] Appointment(In office/virtual)/ []  [place] [facility]/ [] Home Care/ [] Refused Recommended Disposition /[] [place] [facility]/ [x]  Follow-up with PCP Additional Notes: Patient is calling to request provider order lab testing for strep- he states he is not better with antibiotic he is currently taking. Patient states he has [employer] and testing is free for him.

## 2023-01-29 NOTE — Telephone Encounter (Signed)
Summary: congestion and sore throat / rx concern   The patient is continuing to experience congestion as well as a sore throat that they were previously seen for on 01/26/23  The patient shares that their abx and salt water gargling have done little to improve their sinus discomfort and sore throat  The patient would like to discuss their concerns further when possible         Reason for Disposition . [1] Taking antibiotic < 72 hours (3 days) AND [2] sinus pain not improved  Answer Assessment - Initial Assessment Questions 1. ANTIBIOTIC: "What antibiotic are you taking?" "How many times a day?"     Amoxicillin 875 bid 2. ONSET: "When was the antibiotic started?"     01/27/23 3. PAIN: "How bad is the sinus pain?"   (Scale 1-10; mild, moderate or severe)   - MILD (1-3): doesn't interfere with normal activities    - MODERATE (4-7): interferes with normal activities (e.g., work or school) or awakens from sleep   - SEVERE (8-10): excruciating pain and patient unable to do any normal activities        Sore throat, more sinus pressure noted, moderate  4. FEVER: "Do you have a fever?" If Yes, ask: "What is it, how was it measured, and when did it start?"      no 5. SYMPTOMS: "Are there any other symptoms you're concerned about?" If Yes, ask: "When did it start?"     Sore throat- patient requesting lab orders for culture  Protocols used: Sinus Infection on Antibiotic Follow-up Call-A-AH

## 2023-02-04 ENCOUNTER — Ambulatory Visit (INDEPENDENT_AMBULATORY_CARE_PROVIDER_SITE_OTHER): Payer: 59 | Admitting: Physician Assistant

## 2023-02-04 ENCOUNTER — Encounter: Payer: Self-pay | Admitting: Physician Assistant

## 2023-02-04 VITALS — BP 139/89 | HR 56 | Temp 97.5°F | Wt 188.0 lb

## 2023-02-04 DIAGNOSIS — H6123 Impacted cerumen, bilateral: Secondary | ICD-10-CM

## 2023-02-04 DIAGNOSIS — J301 Allergic rhinitis due to pollen: Secondary | ICD-10-CM

## 2023-02-04 MED ORDER — CETIRIZINE HCL 5 MG PO TABS: 5.0000 mg | ORAL_TABLET | Freq: Two times a day (BID) | ORAL | 1 refills | Status: DC

## 2023-02-04 NOTE — Progress Notes (Signed)
Ar[place]ina [place],acting as a scribe for Mikey Kirschner, PA-C.,have documented all relevant documentation on the behalf of Mikey Kirschner, PA-C,as directed by  Mikey Kirschner, PA-C while in the presence of Mikey Kirschner, PA-C.     Established patient visit   Patient: Derrick Romero   DOB: June 18, 1961   62 y.o. Male  MRN: [id number] Visit Date: 02/04/2023  Today's healthcare provider: Mikey Kirschner, PA-C   Cc. Right ear stopped up , nasal congestion  Subjective    HPI  Pt reports feeling better but his right ear is stopped up with some ringing occasionally. He has finished his prednisone and is almost done with the antibiotic. He reports eye itching, nasal congestion.    Medications: Outpatient Medications Prior to Visit  Medication Sig   albuterol (VENTOLIN HFA) 108 (90 Base) MCG/ACT inhaler Inhale 2 puffs into the lungs every 6 (six) hours as needed for wheezing or shortness of breath.   Ascorbic Acid (VITAMIN C) 100 MG tablet Take 100 mg by mouth daily.   azelastine (ASTELIN) 0.1 % nasal spray Place 1 spray into both nostrils 2 (two) times daily. Use in each nostril as directed   cholecalciferol (VITAMIN D3) 25 MCG (1000 UT) tablet Take 1,000 Units by mouth daily.   divalproex (DEPAKOTE ER) 500 MG 24 hr tablet TAKE 2 TABLETS(1000 MG) BY MOUTH AT BEDTIME   fexofenadine (ALLEGRA) 180 MG tablet Take 180 mg by mouth daily as needed.    Glucosamine-Chondroitin (GLUCOSAMINE CHONDR COMPLEX PO) Take by mouth.   Multiple Vitamins-Minerals (ONE DAILY MENS 50+ MULTIVIT PO) Take by mouth.   psyllium (METAMUCIL) 58.6 % packet Take 1 packet by mouth daily.   sildenafil (VIAGRA) 100 MG tablet Take 0.5-1 tablets (50-100 mg total) by mouth as needed for erectile dysfunction.   tamsulosin (FLOMAX) 0.4 MG CAPS capsule Take 2 capsules (0.8 mg total) by mouth daily.   No facility-administered medications prior to visit.    Review of Systems  HENT:  Positive for congestion, ear pain  (fullnes-right), hearing loss, postnasal drip, rhinorrhea, sinus pain and tinnitus. Negative for sinus pressure, sore throat and trouble swallowing.   Respiratory:  Positive for cough. Negative for shortness of breath and wheezing.   Cardiovascular:  Negative for chest pain.  Psychiatric/Behavioral:  Positive for sleep disturbance.      Objective    BP 139/89 (BP Location: Right Arm, Patient Position: Sitting, Cuff Size: Normal)   Pulse (!) 56   Temp (!) 97.5 F (36.4 C) (Oral)   Wt 188 lb (85.3 kg)   SpO2 99%   BMI 26.98 kg/m    Physical Exam Vitals reviewed.  Constitutional:      Appearance: He is not ill-appearing.  HENT:     Head: Normocephalic.     Right Ear: There is impacted cerumen.     Left Ear: There is impacted cerumen.  Eyes:     Conjunctiva/sclera: Conjunctivae normal.  Cardiovascular:     Rate and Rhythm: Normal rate.  Pulmonary:     Effort: Pulmonary effort is normal. No respiratory distress.  Neurological:     General: No focal deficit present.     Mental Status: He is alert and oriented to person, place, and time.  Psychiatric:        Mood and Affect: Mood normal.        Behavior: Behavior normal.      No results found for any visits on 02/04/23.  Assessment & Plan     Impacted cerumen  B/l ears. Pt prefers to flush on his own. Instructed to do so.   2. Allergic rhinitis Severe, referring to ent.  Advised switching from allegra to zyretec bid  Continue nasal sprays  Return if symptoms worsen or fail to improve.      I, Mikey Kirschner, PA-C have reviewed all documentation for this visit. The documentation on  02/04/23 for the exam, diagnosis, procedures, and orders are all accurate and complete.  Mikey Kirschner, PA-C [facility] [address] [place], [place], [address] Office: [phone] Fax: [place]

## 2023-04-01 ENCOUNTER — Ambulatory Visit: Payer: 59 | Admitting: Family Medicine

## 2023-04-01 ENCOUNTER — Ambulatory Visit: Payer: Self-pay

## 2023-04-01 ENCOUNTER — Encounter: Payer: Self-pay | Admitting: Family Medicine

## 2023-04-01 VITALS — BP 132/80 | HR 60 | Resp 16 | Wt 188.3 lb

## 2023-04-01 DIAGNOSIS — R0981 Nasal congestion: Secondary | ICD-10-CM

## 2023-04-01 DIAGNOSIS — J301 Allergic rhinitis due to pollen: Secondary | ICD-10-CM

## 2023-04-01 MED ORDER — FLUTICASONE PROPIONATE 50 MCG/ACT NA SUSP: 2.0000 | Freq: Every day | NASAL | 6 refills | Status: AC

## 2023-04-01 MED ORDER — MONTELUKAST SODIUM 10 MG PO TABS: 10.0000 mg | ORAL_TABLET | Freq: Every day | ORAL | 3 refills | Status: AC

## 2023-04-01 MED ORDER — PREDNISONE 20 MG PO TABS: 40.0000 mg | ORAL_TABLET | Freq: Every day | ORAL | 0 refills | Status: AC

## 2023-04-01 NOTE — Progress Notes (Signed)
I,Joseline E Rosas,acting as a scribe for Shirlee Latch, MD.,have documented all relevant documentation on the behalf of Shirlee Latch, MD,as directed by  Shirlee Latch, MD while in the presence of Shirlee Latch, MD.   Established patient visit   Patient: Derrick Romero   DOB: 1961/03/21   62 y.o. Male  MRN: [id number] Visit Date: 04/01/2023  Today's healthcare provider: Shirlee Latch, MD   Chief Complaint  Patient presents with   Cough   Subjective    HPI  Cough: Patient complains of productive cough and wheezing.  Symptoms began several weeks ago.  The cough is productive of clear sputum, productive of green/yellow sputum, with wheezing, with shortness of breath and is aggravated by nothing Associated symptoms include:postnasal drip, shortness of breath, sputum production, and wheezing. Patient does have a history of asthma. Patient does not have a history of smoking. Patient  does not have previous Chest X-ray. Treatment tried: nasal spray and allergy medicines.  Discussed the use of AI scribe software for clinical note transcription with the patient, who gave verbal consent to proceed.  History of Present Illness   The patient, with a history of allergies and sinus infection, presents with a productive cough, wheezing, and sinus congestion that has been ongoing for several weeks. The patient reports bringing up greenish-yellow phlegm, especially in the mornings, and the symptoms seem to worsen when lying down. The patient has been previously treated for allergies and sinus infection, with changes made to their allergy medications from Allegra to Zyrtec and Azelastine. Despite these changes, the patient's symptoms seem to have worsened over the past few days, with increased wheezing and sinus congestion. The patient has not been using an albuterol inhaler. The patient's sleep has been affected due to the symptoms, and they report feeling exhausted. The patient  denies having any fevers.       Medications: Outpatient Medications Prior to Visit  Medication Sig   Ascorbic Acid (VITAMIN C) 100 MG tablet Take 100 mg by mouth daily.   azelastine (ASTELIN) 0.1 % nasal spray Place 1 spray into both nostrils 2 (two) times daily. Use in each nostril as directed   cetirizine (ZYRTEC) 5 MG tablet Take 1 tablet (5 mg total) by mouth in the morning and at bedtime.   cholecalciferol (VITAMIN D3) 25 MCG (1000 UT) tablet Take 1,000 Units by mouth daily.   divalproex (DEPAKOTE ER) 500 MG 24 hr tablet TAKE 2 TABLETS(1000 MG) BY MOUTH AT BEDTIME   Glucosamine-Chondroitin (GLUCOSAMINE CHONDR COMPLEX PO) Take by mouth.   Multiple Vitamins-Minerals (ONE DAILY MENS 50+ MULTIVIT PO) Take by mouth.   psyllium (METAMUCIL) 58.6 % packet Take 1 packet by mouth daily.   sildenafil (VIAGRA) 100 MG tablet Take 0.5-1 tablets (50-100 mg total) by mouth as needed for erectile dysfunction.   tamsulosin (FLOMAX) 0.4 MG CAPS capsule Take 2 capsules (0.8 mg total) by mouth daily.   albuterol (VENTOLIN HFA) 108 (90 Base) MCG/ACT inhaler Inhale 2 puffs into the lungs every 6 (six) hours as needed for wheezing or shortness of breath.   [DISCONTINUED] fexofenadine (ALLEGRA) 180 MG tablet Take 180 mg by mouth daily as needed.    No facility-administered medications prior to visit.    Review of Systems per HPI     Objective    BP 132/80 (BP Location: Left Arm, Patient Position: Sitting, Cuff Size: Large)   Pulse 60   Resp 16   Wt 188 lb 4.8 oz (85.4 kg)  SpO2 99%   BMI 27.02 kg/m    Physical Exam Vitals reviewed.  Constitutional:      General: He is not in acute distress.    Appearance: Normal appearance. He is not diaphoretic.  HENT:     Head: Normocephalic and atraumatic.     Right Ear: Tympanic membrane, ear canal and external ear normal.     Left Ear: Tympanic membrane, ear canal and external ear normal.     Nose: Congestion present.     Mouth/Throat:     Mouth:  Mucous membranes are moist.     Pharynx: No oropharyngeal exudate or posterior oropharyngeal erythema.  Eyes:     General: No scleral icterus.    Conjunctiva/sclera: Conjunctivae normal.  Cardiovascular:     Rate and Rhythm: Normal rate and regular rhythm.     Heart sounds: Normal heart sounds. No murmur heard. Pulmonary:     Effort: Pulmonary effort is normal. No respiratory distress.     Breath sounds: Normal breath sounds. No wheezing or rhonchi.  Abdominal:     General: There is no distension.     Palpations: Abdomen is soft.     Tenderness: There is no abdominal tenderness.  Musculoskeletal:     Cervical back: Neck supple.     Right lower leg: No edema.     Left lower leg: No edema.  Lymphadenopathy:     Cervical: No cervical adenopathy.  Skin:    General: Skin is warm and dry.  Neurological:     Mental Status: He is alert and oriented to person, place, and time. Mental status is at baseline.  Psychiatric:        Mood and Affect: Mood normal.        Behavior: Behavior normal.       No results found for any visits on 04/01/23.  Assessment & Plan     Problem List Items Addressed This Visit   None Visit Diagnoses     Seasonal allergic rhinitis due to pollen    -  Primary   Relevant Orders   Ambulatory referral to ENT   Sinus congestion       Relevant Orders   Ambulatory referral to ENT      Chronic Sinusitis: Persistent symptoms despite Zyrtec and Astelin. Productive cough with greenish-yellow sputum, worse in the morning. Symptoms exacerbated by lying down. Sleep disruption due to cough. -Add Singulair 10mg  at bedtime. -Add Flonase nasal spray, two sprays each side once a day, alternating with Astelin. -Start Prednisone, two pills each morning with food for one week. -Continue Zyrtec and Astelin. -Referred to ENT in [facility] or [place].  Sleep Disruption: Secondary to chronic sinusitis and cough. -Management as above for sinusitis.  Work Status:  Difficulty managing work due to sleep disruption and ongoing symptoms. -Recommended working remotely until June 10th.        Return for as scheduled.      I, Shirlee Latch, MD, have reviewed all documentation for this visit. The documentation on 04/01/23 for the exam, diagnosis, procedures, and orders are all accurate and complete.   Urbano Milhouse, Marzella Schlein, MD, MPH [facility]

## 2023-04-01 NOTE — Telephone Encounter (Signed)
     Chief Complaint: Productive cough, wheezing Symptoms: Above Frequency: Several weeks Pertinent Negatives: Patient denies fever Disposition: [] ED /[] Urgent Care (no appt availability in office) / [] Appointment(In office/virtual)/ []  [place] [facility]/ [] Home Care/ [] Refused Recommended Disposition /[] [place] [facility]/ [x]  Follow-up with PCP Additional Notes: Pt. Has appointment for today.  Answer Assessment - Initial Assessment Questions 1. ONSET: "When did the cough begin?"      Several weeks 2. SEVERITY: "How bad is the cough today?"      Severe 3. SPUTUM: "Describe the color of your sputum" (none, dry cough; clear, white, yellow, green)     Greenish- yellow 4. HEMOPTYSIS: "Are you coughing up any blood?" If so ask: "How much?" (flecks, streaks, tablespoons, etc.)     No 5. DIFFICULTY BREATHING: "Are you having difficulty breathing?" If Yes, ask: "How bad is it?" (e.g., mild, moderate, severe)    - MILD: No SOB at rest, mild SOB with walking, speaks normally in sentences, can lie down, no retractions, pulse < 100.    - MODERATE: SOB at rest, SOB with minimal exertion and prefers to sit, cannot lie down flat, speaks in phrases, mild retractions, audible wheezing, pulse 100-120.    - SEVERE: Very SOB at rest, speaks in single words, struggling to breathe, sitting hunched forward, retractions, pulse > 120      Mild 6. FEVER: "Do you have a fever?" If Yes, ask: "What is your temperature, how was it measured, and when did it start?"     No 7. CARDIAC HISTORY: "Do you have any history of heart disease?" (e.g., heart attack, congestive heart failure)      No 8. LUNG HISTORY: "Do you have any history of lung disease?"  (e.g., pulmonary embolus, asthma, emphysema)     Asthma 9. PE RISK FACTORS: "Do you have a history of blood clots?" (or: recent major surgery, recent prolonged travel, bedridden)     No 10. OTHER SYMPTOMS: "Do you have any other symptoms?" (e.g., runny nose,  wheezing, chest pain)       Some wheezing 11. PREGNANCY: "Is there any chance you are pregnant?" "When was your last menstrual period?"       N/a 12. TRAVEL: "Have you traveled out of the country in the last month?" (e.g., travel history, exposures)       No  Protocols used: Cough - Acute Productive-A-AH

## 2023-04-01 NOTE — Progress Notes (Deleted)
      Established patient visit   Patient: Derrick Romero   DOB: 1961/07/07   62 y.o. Male  MRN: [id number] Visit Date: 04/01/2023  Today's healthcare provider: Shirlee Latch, MD   No chief complaint on file.  Subjective    Cough   ***  Medications: Outpatient Medications Prior to Visit  Medication Sig  . albuterol (VENTOLIN HFA) 108 (90 Base) MCG/ACT inhaler Inhale 2 puffs into the lungs every 6 (six) hours as needed for wheezing or shortness of breath.  . Ascorbic Acid (VITAMIN C) 100 MG tablet Take 100 mg by mouth daily.  Marland Kitchen azelastine (ASTELIN) 0.1 % nasal spray Place 1 spray into both nostrils 2 (two) times daily. Use in each nostril as directed  . cetirizine (ZYRTEC) 5 MG tablet Take 1 tablet (5 mg total) by mouth in the morning and at bedtime.  . cholecalciferol (VITAMIN D3) 25 MCG (1000 UT) tablet Take 1,000 Units by mouth daily.  . divalproex (DEPAKOTE ER) 500 MG 24 hr tablet TAKE 2 TABLETS(1000 MG) BY MOUTH AT BEDTIME  . fexofenadine (ALLEGRA) 180 MG tablet Take 180 mg by mouth daily as needed.   . Glucosamine-Chondroitin (GLUCOSAMINE CHONDR COMPLEX PO) Take by mouth.  . Multiple Vitamins-Minerals (ONE DAILY MENS 50+ MULTIVIT PO) Take by mouth.  . psyllium (METAMUCIL) 58.6 % packet Take 1 packet by mouth daily.  . sildenafil (VIAGRA) 100 MG tablet Take 0.5-1 tablets (50-100 mg total) by mouth as needed for erectile dysfunction.  . tamsulosin (FLOMAX) 0.4 MG CAPS capsule Take 2 capsules (0.8 mg total) by mouth daily.   No facility-administered medications prior to visit.    Review of Systems  Respiratory:  Positive for cough.    {Labs  Heme  Chem  Endocrine  Serology  Results Review (optional):23779}   Objective    There were no vitals taken for this visit. {Show previous vital signs (optional):23777}  Physical Exam  ***  No results found for any visits on 04/01/23.  Assessment & Plan     ***  No follow-ups on file.      {provider  attestation***:1}   Shirlee Latch, MD  [facility] [phone] (phone) [phone] (fax)  [facility]

## 2023-05-09 ENCOUNTER — Other Ambulatory Visit: Payer: Self-pay

## 2023-05-09 DIAGNOSIS — F3177 Bipolar disorder, in partial remission, most recent episode mixed: Secondary | ICD-10-CM

## 2023-05-09 MED ORDER — DIVALPROEX SODIUM ER 500 MG PO TB24
ORAL_TABLET | ORAL | 0 refills | Status: DC
Start: 2023-05-09 — End: 2023-05-14

## 2023-05-14 ENCOUNTER — Ambulatory Visit (INDEPENDENT_AMBULATORY_CARE_PROVIDER_SITE_OTHER): Payer: 59 | Admitting: Psychiatry

## 2023-05-14 ENCOUNTER — Encounter: Payer: Self-pay | Admitting: Psychiatry

## 2023-05-14 DIAGNOSIS — G4700 Insomnia, unspecified: Secondary | ICD-10-CM

## 2023-05-14 DIAGNOSIS — F3177 Bipolar disorder, in partial remission, most recent episode mixed: Secondary | ICD-10-CM

## 2023-05-14 MED ORDER — DIVALPROEX SODIUM ER 500 MG PO TB24
ORAL_TABLET | ORAL | 1 refills | Status: DC
Start: 2023-05-14 — End: 2024-02-15

## 2023-05-14 MED ORDER — TRAZODONE HCL 100 MG PO TABS
ORAL_TABLET | ORAL | 5 refills | Status: DC
Start: 2023-05-14 — End: 2023-06-08

## 2023-05-14 NOTE — Progress Notes (Signed)
Derrick Romero [id number] Jan 23, 1961 62 y.o.  Subjective:   Patient ID:  Derrick Romero is a 62 y.o. (DOB February 15, 1961) male.  Chief Complaint:  Chief Complaint  Patient presents with   Sleeping Problem   Follow-up    Bipolar disorder    HPI Derrick Romero presents to the office today for follow-up of mood disturbance and insomnia. He reports that his mood has been ok. He reports that he has not been able to sleep for longer than 4 hours since the end of January. He reports that he sleeps slightly more on the weekends. He reports that he awakens frequently during the night to urinate. He reports that he feels exhausted when he gets home and naps for 1.5 hours and then has difficulty going to bed until later. Denies having anxious thoughts during the night when awakening or trying to go to sleep. Denies difficulty falling asleep. He reports that regardless fof when he goes to bed, he is awakening between 2-2:30 am. He reports that his smart watch indicates poor quality sleep.   He is now driving into the office 3 days a week with a 45 minute commute each way. He reports that there is some discussion about moving towards 5 days a week. He has been going into the office early so he can leave earlier and avoid peak of traffic. He reports that he works out on the way to work because he is too tired at the end of the day. He reports that he also has to allow time to pack lunch.   Denies depressed mood. He reports that motivation is good. Energy is low due to sleep disturbance. He notices poor spatial awareness and has been bumping into things and dropping things. He reports this causes frustration and denies any other irritability. He reports adequate concentration and focus. Denies impulsivity or risky behavior. Appetite has been good. Denies SI.   He reports that he has been increasing physical activity.   PHQ2-9    Flowsheet Row Office Visit from 02/04/2023 in [facility] Office Visit from 09/15/2021 in [facility] Office Visit from 06/28/2017 in [facility]  PHQ-2 Total Score 0 0 0  PHQ-9 Total Score 8 1 --        Review of Systems:  Review of Systems  Eyes:        Floaters  Genitourinary:        Frequent urination at night  Musculoskeletal:  Negative for gait problem.  Allergic/Immunologic: Positive for environmental allergies.  Neurological:  Negative for tremors.       He reports occasional auras  Psychiatric/Behavioral:         Please refer to HPI    Medications: I have reviewed the patient's current medications.  Current Outpatient Medications  Medication Sig Dispense Refill   Ascorbic Acid (VITAMIN C) 100 MG tablet Take 100 mg by mouth daily.     azelastine (ASTELIN) 0.1 % nasal spray Place 1 spray into both nostrils 2 (two) times daily. Use in each nostril as directed 30 mL 1   cetirizine (ZYRTEC) 5 MG tablet Take 1 tablet (5 mg total) by mouth in the morning and at bedtime. (Patient taking differently: Take 10 mg by mouth at bedtime.) 180 tablet 1   cholecalciferol (VITAMIN D3) 25 MCG (1000 UT) tablet Take 1,000 Units by mouth daily.     divalproex (DEPAKOTE ER) 500 MG 24 hr tablet TAKE 2  TABLETS(1000 MG) BY MOUTH AT BEDTIME 180 tablet 0   fluticasone (FLONASE) 50 MCG/ACT nasal spray Place 2 sprays into both nostrils daily. 16 g 6   Glucosamine-Chondroitin (GLUCOSAMINE CHONDR COMPLEX PO) Take by mouth.     montelukast (SINGULAIR) 10 MG tablet Take 1 tablet (10 mg total) by mouth at bedtime. 30 tablet 3   Multiple Vitamins-Minerals (ONE DAILY MENS 50+ MULTIVIT PO) Take by mouth.     psyllium (METAMUCIL) 58.6 % packet Take 1 packet by mouth daily as needed.     sildenafil (VIAGRA) 100 MG tablet Take 0.5-1 tablets (50-100 mg total) by mouth as needed for erectile dysfunction. 10 tablet 11   tamsulosin (FLOMAX) 0.4 MG CAPS capsule Take 2 capsules (0.8 mg total) by mouth daily. 180  capsule 1   albuterol (VENTOLIN HFA) 108 (90 Base) MCG/ACT inhaler Inhale 2 puffs into the lungs every 6 (six) hours as needed for wheezing or shortness of breath. (Patient not taking: Reported on 05/14/2023) 54 g 1   No current facility-administered medications for this visit.    Medication Side Effects: None  Allergies:  Allergies  Allergen Reactions   Oak Bark  [Quercus Robur] Other (See Comments) and Shortness Of Breath    Past Medical History:  Diagnosis Date   Allergic rhinitis    Dupuytren's disease     Past Medical History, Surgical history, Social history, and Family history were reviewed and updated as appropriate.   Please see review of systems for further details on the patient's review from today.   Objective:   Physical Exam:  There were no vitals taken for this visit.  Physical Exam Constitutional:      General: He is not in acute distress. Musculoskeletal:        General: No deformity.  Neurological:     Mental Status: He is alert and oriented to person, place, and time.     Coordination: Coordination normal.  Psychiatric:        Attention and Perception: Attention and perception normal. He does not perceive auditory or visual hallucinations.        Mood and Affect: Mood is not anxious or depressed. Affect is not labile, blunt, angry or inappropriate.        Speech: Speech normal.        Behavior: Behavior normal.        Thought Content: Thought content normal. Thought content is not paranoid or delusional. Thought content does not include homicidal or suicidal ideation. Thought content does not include homicidal or suicidal plan.        Cognition and Memory: Cognition and memory normal.        Judgment: Judgment normal.     Comments: Insight intact Mood is mildly frustrated     Lab Review:     Component Value Date/Time   NA 134 12/16/2022 0806   K 4.5 12/16/2022 0806   CL 96 12/16/2022 0806   CO2 21 12/16/2022 0806   GLUCOSE 114 (H) 12/16/2022  0806   GLUCOSE 108 (H) 01/20/2019 2237   BUN 12 12/16/2022 0806   CREATININE 0.94 12/16/2022 0806   CALCIUM 9.8 12/16/2022 0806   PROT 7.5 12/16/2022 0806   ALBUMIN 4.6 12/16/2022 0806   AST 30 12/16/2022 0806   ALT 34 12/16/2022 0806   ALKPHOS 68 12/16/2022 0806   BILITOT 0.3 12/16/2022 0806   GFRNONAA 96 10/10/2020 1021   GFRAA 111 10/10/2020 1021       Component Value Date/Time  WBC 7.8 12/16/2022 0806   WBC 9.1 01/20/2019 2237   RBC 4.57 12/16/2022 0806   RBC 4.54 01/20/2019 2237   HGB 14.0 12/16/2022 0806   HCT 42.4 12/16/2022 0806   PLT 330 12/16/2022 0806   MCV 93 12/16/2022 0806   MCH 30.6 12/16/2022 0806   MCH 30.4 01/20/2019 2237   MCHC 33.0 12/16/2022 0806   MCHC 32.9 01/20/2019 2237   RDW 12.2 12/16/2022 0806   LYMPHSABS 2.5 06/02/2019 1521   EOSABS 0.2 06/02/2019 1521   BASOSABS 0.1 06/02/2019 1521    No results found for: "POCLITH", "LITHIUM"   Lab Results  Component Value Date   VALPROATE 18 (L) 12/16/2022     .res Assessment: Plan:   37 minutes spent dedicated to the care of this patient on the date of this encounter to include pre-visit review of records, ordering of medication, post visit documentation, and face-to-face time with the patient discussing treatment options for insomnia and possible FMLA accommodation. Discussed potential benefits, risks, and side effects of Trazodone. Pt agrees to trial of Trazodone. Will start Trazodone 100 mg 1/2-1 tablet at bedtime as needed for insomnia.  Advised pt to contact office if insomnia does not improve since sleep deprivation can increase risk of mania and worsening mood symptoms. Will continue Depakote ER 1000 mg po at bedtime for mood stabilization.  Discussed considering FMLA accommodation since he has had less time for sleep and self-care, to include physical activity, eating regularly, and rest since being required to be in-office more days and commuting 45 minutes each way. Discussed that maintaining  a consistent schedule, getting adequate sleep, and having time for physical activity and self-care are necessary with maintaining mood stability.  Recommended following up with medical providers regarding frequent urination since this is contributing to sleep disturbance.  Reviewed most recent labs and discussed that CBC and CMP were within normal limits. Pt to follow-up in 6 months or sooner if clinically indicated.  Patient advised to contact office with any questions, adverse effects, or acute worsening in signs and symptoms.   Derrick Romero was seen today for sleeping problem and follow-up.  Diagnoses and all orders for this visit:  Insomnia, unspecified type  Bipolar disorder, in partial remission, most recent episode mixed [facility])     Please see After Visit Summary for patient specific instructions.  Future Appointments  Date Time Provider Department Center  06/08/2023  8:20 AM Beryle Flock, Marzella Schlein, MD [facility]    No orders of the defined types were placed in this encounter.   -------------------------------

## 2023-05-28 ENCOUNTER — Encounter: Payer: Self-pay | Admitting: Family Medicine

## 2023-05-28 ENCOUNTER — Ambulatory Visit: Payer: 59 | Admitting: Family Medicine

## 2023-05-28 VITALS — BP 141/82 | HR 68 | Ht 70.0 in | Wt 189.6 lb

## 2023-05-28 DIAGNOSIS — R5383 Other fatigue: Secondary | ICD-10-CM | POA: Insufficient documentation

## 2023-05-28 DIAGNOSIS — R7303 Prediabetes: Secondary | ICD-10-CM

## 2023-05-28 DIAGNOSIS — M5431 Sciatica, right side: Secondary | ICD-10-CM | POA: Insufficient documentation

## 2023-05-28 DIAGNOSIS — F5104 Psychophysiologic insomnia: Secondary | ICD-10-CM | POA: Diagnosis not present

## 2023-05-28 DIAGNOSIS — F3174 Bipolar disorder, in full remission, most recent episode manic: Secondary | ICD-10-CM | POA: Insufficient documentation

## 2023-05-28 DIAGNOSIS — R030 Elevated blood-pressure reading, without diagnosis of hypertension: Secondary | ICD-10-CM | POA: Insufficient documentation

## 2023-05-28 DIAGNOSIS — N401 Enlarged prostate with lower urinary tract symptoms: Secondary | ICD-10-CM

## 2023-05-28 DIAGNOSIS — E785 Hyperlipidemia, unspecified: Secondary | ICD-10-CM | POA: Insufficient documentation

## 2023-05-28 DIAGNOSIS — R351 Nocturia: Secondary | ICD-10-CM

## 2023-05-28 DIAGNOSIS — Z8639 Personal history of other endocrine, nutritional and metabolic disease: Secondary | ICD-10-CM | POA: Insufficient documentation

## 2023-05-28 MED ORDER — QUETIAPINE FUMARATE ER 50 MG PO TB24
50.0000 mg | ORAL_TABLET | Freq: Every day | ORAL | 2 refills | Status: DC
Start: 2023-05-28 — End: 2023-06-08

## 2023-05-28 NOTE — Assessment & Plan Note (Signed)
Chronic; slight improvement with trazodone Will try low dose seroquel to assist Encouraged to try on day without early awakening following

## 2023-05-28 NOTE — Progress Notes (Signed)
Established patient visit   Patient: Derrick Romero   DOB: 1961-05-31   62 y.o. Male  MRN: [id number] Visit Date: 05/28/2023  Today's healthcare provider: Jacky Kindle, FNP  Introduced to nurse practitioner role and practice setting.  All questions answered.  Discussed provider/patient relationship and expectations.  Subjective    HPI HPI   Patient is present due to insomnia and numbness in right foot. Patient reports insomnia for the last 4 months and reports psychiatrist prescribed trazadone which has helped improve it some. He reports numbness has been waxing and weaning for the last 6 months.  Last edited by Acey Lav, CMA on 05/28/2023  1:26 PM.      Medications: Outpatient Medications Prior to Visit  Medication Sig   albuterol (VENTOLIN HFA) 108 (90 Base) MCG/ACT inhaler Inhale 2 puffs into the lungs every 6 (six) hours as needed for wheezing or shortness of breath.   Ascorbic Acid (VITAMIN C) 100 MG tablet Take 100 mg by mouth daily.   azelastine (ASTELIN) 0.1 % nasal spray Place 1 spray into both nostrils 2 (two) times daily. Use in each nostril as directed   cetirizine (ZYRTEC) 5 MG tablet Take 1 tablet (5 mg total) by mouth in the morning and at bedtime. (Patient taking differently: Take 10 mg by mouth at bedtime.)   cholecalciferol (VITAMIN D3) 25 MCG (1000 UT) tablet Take 1,000 Units by mouth daily.   divalproex (DEPAKOTE ER) 500 MG 24 hr tablet TAKE 2 TABLETS(1000 MG) BY MOUTH AT BEDTIME   fluticasone (FLONASE) 50 MCG/ACT nasal spray Place 2 sprays into both nostrils daily.   Glucosamine-Chondroitin (GLUCOSAMINE CHONDR COMPLEX PO) Take by mouth.   montelukast (SINGULAIR) 10 MG tablet Take 1 tablet (10 mg total) by mouth at bedtime.   Multiple Vitamins-Minerals (ONE DAILY MENS 50+ MULTIVIT PO) Take by mouth.   psyllium (METAMUCIL) 58.6 % packet Take 1 packet by mouth daily as needed.   sildenafil (VIAGRA) 100 MG tablet Take 0.5-1 tablets (50-100 mg total) by  mouth as needed for erectile dysfunction.   tamsulosin (FLOMAX) 0.4 MG CAPS capsule Take 2 capsules (0.8 mg total) by mouth daily.   traZODone (DESYREL) 100 MG tablet Take 1/2-1 tablet po QHS prn insomnia   No facility-administered medications prior to visit.     Objective    BP (!) 141/82 (BP Location: Left Arm, Patient Position: Sitting, Cuff Size: Normal)   Pulse 68   Ht 5\' 10"  (1.778 m)   Wt 189 lb 9.6 oz (86 kg)   SpO2 99%   BMI 27.20 kg/m   Physical Exam Vitals and nursing note reviewed.  Constitutional:      Appearance: Normal appearance. He is overweight.  HENT:     Head: Normocephalic and atraumatic.  Eyes:     Pupils: Pupils are equal, round, and reactive to light.  Cardiovascular:     Rate and Rhythm: Normal rate and regular rhythm.     Pulses: Normal pulses.          Dorsalis pedis pulses are 2+ on the right side.       Posterior tibial pulses are 2+ on the right side.     Heart sounds: Normal heart sounds.  Pulmonary:     Effort: Pulmonary effort is normal.     Breath sounds: Normal breath sounds.  Musculoskeletal:        General: Normal range of motion.     Cervical back: Normal range of motion.  Right foot: Normal range of motion. No deformity, bunion, Charcot foot, foot drop or prominent metatarsal heads.  Feet:     Right foot:     Skin integrity: Skin integrity normal.     Toenail Condition: Right toenails are normal.  Skin:    General: Skin is warm and dry.     Capillary Refill: Capillary refill takes less than 2 seconds.  Neurological:     General: No focal deficit present.     Mental Status: He is alert and oriented to person, place, and time. Mental status is at baseline.      No results found for any visits on 05/28/23.  Assessment & Plan     Problem List Items Addressed This Visit       Nervous and Auditory   Sciatica of right side without back pain    Known hx of sciatica with back pain; unclear what is cause of R foot pain at this  time There is no injury, intermittent symptoms and concern for 'wetness' feeling on foot when walking No obvious deformity or concern for morton's neuroma       Relevant Medications   QUEtiapine (SEROQUEL XR) 50 MG TB24 24 hr tablet     Other   Benign prostatic hyperplasia with nocturia    Previously uncontrolled; recently increased flomax to 10 mg Uncontrolled Plan to consider addition of cialis 10mg  daily to flomax if not well controlled in 1-2 months ;recommend PSA in place of DRE. If PSA is elevated for age, we will repeat; if PSA remains elevated pt will be referred to urology for DRE and next steps for best treatment.       Relevant Orders   PSA   Bipolar 1 disorder, manic, full remission (HCC)   Relevant Medications   QUEtiapine (SEROQUEL XR) 50 MG TB24 24 hr tablet   Elevated blood pressure reading in office without diagnosis of hypertension    BP elevated today; pt declines recheck Goal of 139/89 or less Continue to monitor stress, sodium intake and exercise habits       Relevant Orders   Basic Metabolic Panel (BMET)   CBC with Differential/Platelet   History of elevated lipids    Repeat LP The 10-year ASCVD risk score (Arnett DK, et al., 2019) is: 9.9% recommend diet low in saturated fat and regular exercise - 30 min at least 5 times per week       Relevant Orders   Lipid panel   Other fatigue    Pt declines concern for mood disorder reporting that hx of bipolar is well controlled Has tried trazodone with slight improvement in sleep habits- able to sleep 1 hour longer Reports concern iso prolonged/stressful drive ~36 mins one way 3x/week Recommend labs to assist Declines concern for OSA a this time      Relevant Orders   Basic Metabolic Panel (BMET)   Hemoglobin A1c   Testosterone,Free and Total   CBC with Differential/Platelet   Prediabetes    Recommend low carb diet Recheck A1c   Chronic, stable Reports early am awakening Repeat A1c; discussed  use of balanced snack p bedtime to assist with Somogyi effect      Relevant Orders   Hemoglobin A1c   Psychophysiological insomnia - Primary    Chronic; slight improvement with trazodone Will try low dose seroquel to assist Encouraged to try on day without early awakening following       Relevant Medications   QUEtiapine (SEROQUEL XR) 50 MG  TB24 24 hr tablet   Return if symptoms worsen or fail to improve.     Leilani Merl, FNP, have reviewed all documentation for this visit. The documentation on 05/28/23 for the exam, diagnosis, procedures, and orders are all accurate and complete.  Jacky Kindle, FNP  [facility] [phone] (phone) [phone] (fax)  [facility]

## 2023-05-28 NOTE — Assessment & Plan Note (Signed)
Pt declines concern for mood disorder reporting that hx of bipolar is well controlled Has tried trazodone with slight improvement in sleep habits- able to sleep 1 hour longer Reports concern iso prolonged/stressful drive ~96 mins one way 3x/week Recommend labs to assist Declines concern for OSA a this time

## 2023-05-28 NOTE — Assessment & Plan Note (Signed)
Known hx of sciatica with back pain; unclear what is cause of R foot pain at this time There is no injury, intermittent symptoms and concern for 'wetness' feeling on foot when walking No obvious deformity or concern for morton's neuroma

## 2023-05-28 NOTE — Assessment & Plan Note (Signed)
Repeat LP The 10-year ASCVD risk score (Arnett DK, et al., 2019) is: 9.9% recommend diet low in saturated fat and regular exercise - 30 min at least 5 times per week

## 2023-05-28 NOTE — Assessment & Plan Note (Signed)
Previously uncontrolled; recently increased flomax to 10 mg Uncontrolled Plan to consider addition of cialis 10mg  daily to flomax if not well controlled in 1-2 months ;recommend PSA in place of DRE. If PSA is elevated for age, we will repeat; if PSA remains elevated pt will be referred to urology for DRE and next steps for best treatment.

## 2023-05-28 NOTE — Assessment & Plan Note (Signed)
BP elevated today; pt declines recheck Goal of 139/89 or less Continue to monitor stress, sodium intake and exercise habits

## 2023-05-28 NOTE — Assessment & Plan Note (Signed)
Recommend low carb diet Recheck A1c   Chronic, stable Reports early am awakening Repeat A1c; discussed use of balanced snack p bedtime to assist with Somogyi effect

## 2023-06-01 NOTE — Progress Notes (Signed)
All labs stable with exception of testosterone which is slightly low; recommend discussion with your PCP at your follow up on next steps.  The 10-year ASCVD risk score (Arnett DK, et al., 2019) is: 9%. Risk of heart attack and stroke is elevated at 9%. Consider start of low fat diet or cholesterol medication to assist.

## 2023-06-08 ENCOUNTER — Ambulatory Visit (INDEPENDENT_AMBULATORY_CARE_PROVIDER_SITE_OTHER): Payer: 59 | Admitting: Family Medicine

## 2023-06-08 ENCOUNTER — Encounter: Payer: Self-pay | Admitting: Family Medicine

## 2023-06-08 VITALS — BP 133/82 | HR 67 | Temp 97.6°F | Resp 16 | Ht 70.0 in | Wt 186.0 lb

## 2023-06-08 DIAGNOSIS — F5104 Psychophysiologic insomnia: Secondary | ICD-10-CM

## 2023-06-08 DIAGNOSIS — R7989 Other specified abnormal findings of blood chemistry: Secondary | ICD-10-CM | POA: Diagnosis not present

## 2023-06-08 DIAGNOSIS — F3174 Bipolar disorder, in full remission, most recent episode manic: Secondary | ICD-10-CM

## 2023-06-08 DIAGNOSIS — Z23 Encounter for immunization: Secondary | ICD-10-CM | POA: Diagnosis not present

## 2023-06-08 DIAGNOSIS — E782 Mixed hyperlipidemia: Secondary | ICD-10-CM

## 2023-06-08 DIAGNOSIS — R7303 Prediabetes: Secondary | ICD-10-CM

## 2023-06-08 DIAGNOSIS — Z0000 Encounter for general adult medical examination without abnormal findings: Secondary | ICD-10-CM | POA: Diagnosis not present

## 2023-06-08 MED ORDER — DOXEPIN HCL 6 MG PO TABS: 3.0000 mg | ORAL_TABLET | Freq: Every day | ORAL | 2 refills | Status: DC

## 2023-06-08 NOTE — Assessment & Plan Note (Signed)
Stable A1C at 5.8, consistent with previous readings. -Continue lifestyle modifications including diet and exercise. -Recheck A1C in 6 months.

## 2023-06-08 NOTE — Assessment & Plan Note (Signed)
Slightly elevated cholesterol with intermediate 10-year risk of heart attack and stroke. -Continue lifestyle modifications including diet and exercise. -Recheck cholesterol in 6 months.

## 2023-06-08 NOTE — Assessment & Plan Note (Signed)
On Seroquel with improved sleep duration but poor quality and morning grogginess. Concerns about interaction with Depakote for bipolar disorder. -Discontinue Seroquel and Trazodone. -Start Doxepin 6mg  at bedtime, starting with half a pill for 3-5 nights, then increase to full dose if needed. -Contact psychiatrist for further management if Doxepin is ineffective.

## 2023-06-08 NOTE — Assessment & Plan Note (Signed)
Stable on Depakote. -Continue Depakote as prescribed. - continue to follow with Psych

## 2023-06-08 NOTE — Progress Notes (Signed)
Complete physical exam  Patient: Derrick Romero   DOB: June 11, 1961   62 y.o. Male  MRN: [id number]  Subjective:    Chief Complaint  Patient presents with   Annual Exam    Derrick Romero is a 62 y.o. male who presents today for a complete physical exam. He reports consuming a general diet.  He generally feels well. He reports sleeping poorly. He does have additional problems to discuss today.   Discussed the use of AI scribe software for clinical note transcription with the patient, who gave verbal consent to proceed.  History of Present Illness   The patient, with a history of bipolar disorder managed with Depakote, presents with ongoing insomnia and fatigue. They report that they have tried trazodone and Seroquel for sleep, but these have not been effective. The patient describes waking up feeling groggy and it takes them about an hour to an hour and a half to fully wake up. They report that the quality of their sleep is not good, despite sleeping longer. They also mention that they have to get up to go to the bathroom during the night, but they are able to fall back asleep.  The patient also reports changes in body mass and distribution, with an increase in abdominal fat. They mention that they have been trying to balance cardio-related activities such as walking, hiking, and kayaking with a reasonable diet. However, they express concern about their ability to manage their schedule due to having to drive to work three days a week. This is causing mental health disturbance.  In addition to these issues, the patient has a history of prediabetes with a slightly elevated A1c and slightly elevated cholesterol. They express a preference for trying dietary changes before considering medication for these conditions.        Most recent fall risk assessment:    05/28/2023    1:28 PM  Fall Risk   Falls in the past year? 0  Risk for fall due to : No Fall Risks  Follow up Falls evaluation  completed     Most recent depression screenings:    05/28/2023    1:28 PM 02/04/2023    4:19 PM  PHQ 2/9 Scores  PHQ - 2 Score 0 0  PHQ- 9 Score  8        Patient Care Team: Erasmo Downer, MD as PCP - General (Family Medicine)   Outpatient Medications Prior to Visit  Medication Sig   albuterol (VENTOLIN HFA) 108 (90 Base) MCG/ACT inhaler Inhale 2 puffs into the lungs every 6 (six) hours as needed for wheezing or shortness of breath.   Ascorbic Acid (VITAMIN C) 100 MG tablet Take 100 mg by mouth daily.   azelastine (ASTELIN) 0.1 % nasal spray Place 1 spray into both nostrils 2 (two) times daily. Use in each nostril as directed   cetirizine (ZYRTEC) 5 MG tablet Take 1 tablet (5 mg total) by mouth in the morning and at bedtime. (Patient taking differently: Take 10 mg by mouth at bedtime.)   cholecalciferol (VITAMIN D3) 25 MCG (1000 UT) tablet Take 1,000 Units by mouth daily.   divalproex (DEPAKOTE ER) 500 MG 24 hr tablet TAKE 2 TABLETS(1000 MG) BY MOUTH AT BEDTIME   fluticasone (FLONASE) 50 MCG/ACT nasal spray Place 2 sprays into both nostrils daily.   Glucosamine-Chondroitin (GLUCOSAMINE CHONDR COMPLEX PO) Take by mouth.   montelukast (SINGULAIR) 10 MG tablet Take 1 tablet (10 mg total) by mouth at bedtime.  Multiple Vitamins-Minerals (ONE DAILY MENS 50+ MULTIVIT PO) Take by mouth.   psyllium (METAMUCIL) 58.6 % packet Take 1 packet by mouth daily as needed.   sildenafil (VIAGRA) 100 MG tablet Take 0.5-1 tablets (50-100 mg total) by mouth as needed for erectile dysfunction.   tamsulosin (FLOMAX) 0.4 MG CAPS capsule Take 2 capsules (0.8 mg total) by mouth daily.   [DISCONTINUED] QUEtiapine (SEROQUEL XR) 50 MG TB24 24 hr tablet Take 1 tablet (50 mg total) by mouth at bedtime.   [DISCONTINUED] traZODone (DESYREL) 100 MG tablet Take 1/2-1 tablet po QHS prn insomnia   No facility-administered medications prior to visit.    ROS Per HPI     Objective:     BP 133/82 (BP  Location: Right Arm, Patient Position: Sitting, Cuff Size: Normal)   Pulse 67   Temp 97.6 F (36.4 C) (Temporal)   Resp 16   Ht 5\' 10"  (1.778 m)   Wt 186 lb (84.4 kg)   SpO2 97%   BMI 26.69 kg/m    Physical Exam Vitals reviewed.  Constitutional:      General: He is not in acute distress.    Appearance: Normal appearance. He is well-developed. He is not diaphoretic.  HENT:     Head: Normocephalic and atraumatic.     Right Ear: Tympanic membrane, ear canal and external ear normal.     Left Ear: Tympanic membrane, ear canal and external ear normal.     Nose: Nose normal.     Mouth/Throat:     Mouth: Mucous membranes are moist.     Pharynx: Oropharynx is clear. No oropharyngeal exudate.  Eyes:     General: No scleral icterus.    Conjunctiva/sclera: Conjunctivae normal.     Pupils: Pupils are equal, round, and reactive to light.  Neck:     Thyroid: No thyromegaly.  Cardiovascular:     Rate and Rhythm: Normal rate and regular rhythm.     Heart sounds: Normal heart sounds. No murmur heard. Pulmonary:     Effort: Pulmonary effort is normal. No respiratory distress.     Breath sounds: Normal breath sounds. No wheezing or rales.  Abdominal:     General: There is no distension.     Palpations: Abdomen is soft.     Tenderness: There is no abdominal tenderness.  Musculoskeletal:        General: No deformity.     Cervical back: Neck supple.     Right lower leg: No edema.     Left lower leg: No edema.  Lymphadenopathy:     Cervical: No cervical adenopathy.  Skin:    General: Skin is warm and dry.     Findings: No rash.  Neurological:     Mental Status: He is alert and oriented to person, place, and time. Mental status is at baseline.     Gait: Gait normal.  Psychiatric:        Mood and Affect: Mood normal.        Behavior: Behavior normal.        Thought Content: Thought content normal.     The 10-year ASCVD risk score (Arnett DK, et al., 2019) is: 8.2%   No results  found for any visits on 06/08/23.     Assessment & Plan:    Routine Health Maintenance and Physical Exam  Immunization History  Administered Date(s) Administered   Covid-19,MRNA Vaccine(Spikevax)69mos. thru 37yrs 08/21/2022   Influenza,inj,Quad PF,6+ Mos 07/20/2016, 09/15/2021   Influenza-Unspecified 08/21/2022  Moderna Covid-19 Vaccine Bivalent Booster 46yrs & up 08/20/2021   Moderna Sars-Covid-2 Vaccination 01/16/2020, 02/19/2020, 10/10/2020   Tdap 12/07/2022   Vaccinia,smallpox Monkeypox Vaccine Live,pf 06/30/2021, 07/31/2021   Zoster Recombinant(Shingrix) 12/07/2022, 06/08/2023    Health Maintenance  Topic Date Due   COVID-19 Vaccine (5 - 2023-24 season) 10/16/2022   INFLUENZA VACCINE  01/31/2024 (Originally 06/03/2023)   Colonoscopy  01/12/2024   DTaP/Tdap/Td (2 - Td or Tdap) 12/07/2032   Hepatitis C Screening  Completed   HIV Screening  Completed   Zoster Vaccines- Shingrix  Completed   HPV VACCINES  Aged Out    Discussed health benefits of physical activity, and encouraged him to engage in regular exercise appropriate for his age and condition.  Problem List Items Addressed This Visit       Other   Prediabetes    Stable A1C at 5.8, consistent with previous readings. -Continue lifestyle modifications including diet and exercise. -Recheck A1C in 6 months.      Psychophysiological insomnia    On Seroquel with improved sleep duration but poor quality and morning grogginess. Concerns about interaction with Depakote for bipolar disorder. -Discontinue Seroquel and Trazodone. -Start Doxepin 6mg  at bedtime, starting with half a pill for 3-5 nights, then increase to full dose if needed. -Contact psychiatrist for further management if Doxepin is ineffective.      Bipolar 1 disorder, manic, full remission (HCC)    Stable on Depakote. -Continue Depakote as prescribed. - continue to follow with Psych      Hyperlipidemia    Slightly elevated cholesterol with  intermediate 10-year risk of heart attack and stroke. -Continue lifestyle modifications including diet and exercise. -Recheck cholesterol in 6 months.      Other Visit Diagnoses     Encounter for annual physical exam    -  Primary   Need for shingles vaccine       Relevant Orders   Varicella-zoster vaccine IM (Completed)   Low testosterone in male       Relevant Orders   Testosterone,Free and Total           Low Testosterone Slightly low levels with symptoms of fatigue, change in body mass and distribution, and erectile dysfunction. -Refer to urology for consideration of testosterone supplementation if repeat AM lab is low.  General Health Maintenance -Administer shingles vaccine today. -Plan for flu shot and COVID booster in the fall. -Colonoscopy due next year.        Return in about 6 months (around 12/09/2023) for chronic disease f/u.     Shirlee Latch, MD

## 2023-06-14 ENCOUNTER — Telehealth: Payer: Self-pay

## 2023-06-14 ENCOUNTER — Telehealth: Payer: Self-pay | Admitting: Family Medicine

## 2023-06-14 DIAGNOSIS — R7989 Other specified abnormal findings of blood chemistry: Secondary | ICD-10-CM

## 2023-06-14 NOTE — Telephone Encounter (Signed)
Pt called in says was told by pharmacy, needs PA for Doxepin HCl 6 MG TABS

## 2023-06-14 NOTE — Telephone Encounter (Signed)
-----   Message from Shirlee Latch sent at 06/10/2023  8:10 AM EDT ----- Free testosterone remains slightly low. Ok to place referral to urology if patient would still like this.

## 2023-06-16 NOTE — Telephone Encounter (Signed)
Sent patient a my chart message asking for RX Card information in order to create a PA for the medication. We have not received a key from the pharmacy needing a PA or in cover my meds.

## 2023-06-18 ENCOUNTER — Telehealth: Payer: Self-pay | Admitting: Family Medicine

## 2023-06-18 NOTE — Telephone Encounter (Signed)
Duplicate.  See other encounter.

## 2023-06-18 NOTE — Telephone Encounter (Signed)
Pt is calling in to check on the status of a PA for Doxepin HCl 6 MG TABS pt says he is going to upload the RX card into MyChart as requested.

## 2023-06-18 NOTE — Telephone Encounter (Signed)
Patient reports that he attached the insurance information an hour ago. Was advised that Pa will be started until Monday. Verbalized understanding.

## 2023-06-21 NOTE — Telephone Encounter (Signed)
Information regarding your request  Message from [facility] [employer]: Drug is not covered by plan

## 2023-06-22 ENCOUNTER — Ambulatory Visit: Payer: 59 | Admitting: Urology

## 2023-06-22 ENCOUNTER — Encounter: Payer: Self-pay | Admitting: Urology

## 2023-06-22 VITALS — BP 121/73 | HR 55 | Wt 184.0 lb

## 2023-06-22 DIAGNOSIS — N401 Enlarged prostate with lower urinary tract symptoms: Secondary | ICD-10-CM | POA: Diagnosis not present

## 2023-06-22 DIAGNOSIS — N528 Other male erectile dysfunction: Secondary | ICD-10-CM | POA: Diagnosis not present

## 2023-06-22 DIAGNOSIS — R351 Nocturia: Secondary | ICD-10-CM | POA: Diagnosis not present

## 2023-06-22 DIAGNOSIS — E291 Testicular hypofunction: Secondary | ICD-10-CM

## 2023-06-22 MED ORDER — CLOMIPHENE CITRATE 50 MG PO TABS: 25.0000 mg | ORAL_TABLET | Freq: Every day | ORAL | 3 refills | Status: DC

## 2023-06-22 MED ORDER — TADALAFIL 5 MG PO TABS
5.0000 mg | ORAL_TABLET | Freq: Every day | ORAL | 11 refills | Status: DC
Start: 2023-06-22 — End: 2023-09-22

## 2023-06-22 NOTE — Progress Notes (Signed)
I,Amy L Pierron,acting as a scribe for Vanna Scotland, MD.,have documented all relevant documentation on the behalf of Vanna Scotland, MD,as directed by  Vanna Scotland, MD while in the presence of Vanna Scotland, MD.  06/22/2023 5:31 PM   Derrick Romero 08/16/1961 [id number]  Referring provider: Erasmo Downer, MD [address] [place],  [place] [address]  Chief Complaint  Patient presents with   LOW TESTOSTERONE    HPI: 62 year-old male who presents today for further evaluation of low testosterone.  His most recent testosterone levels checked on 05/28/2023 were normal at 343. His free testosterone is slightly low at 6.6.   He also has severe erectile dysfunction but his libido is intact. His ADAM symptom score indicates that he has lack of energy, decreased strength and endurance, erectile dysfunction, and fatigue; but denies any mood changes or work performance issues. He's currently taking Sildenafil.   He also has a personal history of BPH and is managed on Flomax. He reports some urinary frequency.   His most recent PSA on 05/28/2023 was 1.4.  He has never been treated for low testosterone. For his ED he uses Sildenafil which does help him.   His main concern is his lack of energy. He can take a nap after work for a couple hours. He was only sleeping 4 hours a night, but takes Trazodone now so he is sleeping an uninterrupted 7 hours a night.   He also has noticed a decrease in his leg hair.  He has been on Flomax for at least a year and does think it helps.    Androgen Deficiency in the Aging Male     Row Name 06/22/23 1600         Androgen Deficiency in the Aging Male   Do you have a decrease in libido (sex drive) No     Do you have lack of energy Yes     Do you have a decrease in strength and/or endurance Yes     Have you lost height No     Have you noticed a decreased "enjoyment of life" No     Are you sad and/or grumpy No     Are your  erections less strong Yes     Have you noticed a recent deterioration in your ability to play sports Yes     Are you falling asleep after dinner Yes     Has there been a recent deterioration in your work performance No              IPSS     Row Name 06/22/23 1600         International Prostate Symptom Score   How often have you had the sensation of not emptying your bladder? Less than 1 in 5     How often have you had to urinate less than every two hours? About half the time     How often have you found you stopped and started again several times when you urinated? More than half the time     How often have you found it difficult to postpone urination? Less than half the time     How often have you had a weak urinary stream? About half the time     How often have you had to strain to start urination? Not at All     How many times did you typically get up at night to urinate? 1 Time  Total IPSS Score 14       Quality of Life due to urinary symptoms   If you were to spend the rest of your life with your urinary condition just the way it is now how would you feel about that? Mixed            Score:  1-7 Mild 8-19 Moderate 20-35 Severe   PMH: Past Medical History:  Diagnosis Date   Allergic rhinitis    Dupuytren's disease     Surgical History: Past Surgical History:  Procedure Laterality Date   COLONOSCOPY WITH PROPOFOL N/A 01/12/2019   Procedure: COLONOSCOPY WITH PROPOFOL;  Surgeon: Pasty Spillers, MD;  Location: [facility] ENDOSCOPY;  Service: Endoscopy;  Laterality: N/A;   ETHMOIDECTOMY     KNEE ARTHROSCOPY      Home Medications:  Allergies as of 06/22/2023       Reactions   [facility]  [quercus Robur] Other (See Comments), Shortness Of Breath        Medication List        Accurate as of June 22, 2023  5:31 PM. If you have any questions, ask your nurse or doctor.          albuterol 108 (90 Base) MCG/ACT inhaler Commonly known as: VENTOLIN  HFA Inhale 2 puffs into the lungs every 6 (six) hours as needed for wheezing or shortness of breath.   azelastine 0.1 % nasal spray Commonly known as: ASTELIN Place 1 spray into both nostrils 2 (two) times daily. Use in each nostril as directed   cetirizine 5 MG tablet Commonly known as: ZYRTEC Take 1 tablet (5 mg total) by mouth in the morning and at bedtime. What changed:  how much to take when to take this   cholecalciferol 25 MCG (1000 UNIT) tablet Commonly known as: VITAMIN D3 Take 1,000 Units by mouth daily.   clomiPHENE 50 MG tablet Commonly known as: CLOMID Take 0.5 tablets (25 mg total) by mouth daily. Started by: Vanna Scotland   divalproex 500 MG 24 hr tablet Commonly known as: DEPAKOTE ER TAKE 2 TABLETS(1000 MG) BY MOUTH AT BEDTIME   Doxepin HCl 6 MG Tabs Take 0.5-1 tablets (3-6 mg total) by mouth at bedtime.   fluticasone 50 MCG/ACT nasal spray Commonly known as: FLONASE Place 2 sprays into both nostrils daily.   GLUCOSAMINE CHONDR COMPLEX PO Take by mouth.   montelukast 10 MG tablet Commonly known as: SINGULAIR Take 1 tablet (10 mg total) by mouth at bedtime.   ONE DAILY MENS 50+ MULTIVIT PO Take by mouth.   psyllium 58.6 % packet Commonly known as: METAMUCIL Take 1 packet by mouth daily as needed.   sildenafil 100 MG tablet Commonly known as: VIAGRA Take 0.5-1 tablets (50-100 mg total) by mouth as needed for erectile dysfunction.   tadalafil 5 MG tablet Commonly known as: CIALIS Take 1 tablet (5 mg total) by mouth daily. Started by: Vanna Scotland   tamsulosin 0.4 MG Caps capsule Commonly known as: FLOMAX Take 2 capsules (0.8 mg total) by mouth daily.   vitamin C 100 MG tablet Take 100 mg by mouth daily.        Allergies:  Allergies  Allergen Reactions   Oak Bark  [Quercus Robur] Other (See Comments) and Shortness Of Breath    Family History: Family History  Problem Relation Age of Onset   Asthma Father    Melanoma Father     Cancer Maternal Grandmother    Heart disease Paternal Grandfather  ADD / ADHD Nephew     Social History:  reports that he has never smoked. He has never used smokeless tobacco. He reports current alcohol use of about 6.0 standard drinks of alcohol per week. He reports that he does not use drugs.   Physical Exam: BP 121/73   Pulse (!) 55   Wt 184 lb (83.5 kg)   BMI 26.40 kg/m   Constitutional:  Alert and oriented, No acute distress. HEENT: [place] AT, moist mucus membranes.  Trachea midline, no masses. Neurologic: Grossly intact, no focal deficits, moving all 4 extremities. Psychiatric: Normal mood and affect.   Assessment & Plan:    BPH  - Continue taking Flomax. Adding 5 mg daily Cialis for urinary symptoms. Will reassess in any improvement over the next 3 months.  - PSA currently stable. Deferred rectal exam due to this and his age.   2. Erectile dysfunction  - Discussed trying daily Cialis which may also help urinary symptom management. If he needs an extra boost can use a half dose of Viagra. Mentioned potential side effects including headaches and low blood pressure.  3. Hypogonadism  - His testosterone is in the normal range. Went over how the body makes this and adverse effects of injecting synthetically.   - Plan on trying Clomid for 3 months and will see if any improvement of symptoms.  Return in about 3 months (around 09/22/2023) for IPSS, PVR, testosterone, LFT's.   [facility] [address], [address] [place], [place] [address] [phone]

## 2023-07-06 ENCOUNTER — Telehealth: Payer: Self-pay | Admitting: Family Medicine

## 2023-07-06 MED ORDER — TAMSULOSIN HCL 0.4 MG PO CAPS: 0.8000 mg | ORAL_CAPSULE | Freq: Every day | ORAL | 1 refills | Status: DC

## 2023-07-06 NOTE — Telephone Encounter (Signed)
Refilled

## 2023-07-06 NOTE — Telephone Encounter (Signed)
[facility] faxed refill request for the following medications:  tamsulosin (FLOMAX) 0.4 MG CAPS capsule   Please advise.

## 2023-09-15 ENCOUNTER — Encounter: Payer: Self-pay | Admitting: Psychiatry

## 2023-09-17 ENCOUNTER — Other Ambulatory Visit: Payer: 59

## 2023-09-17 DIAGNOSIS — N401 Enlarged prostate with lower urinary tract symptoms: Secondary | ICD-10-CM

## 2023-09-17 DIAGNOSIS — E291 Testicular hypofunction: Secondary | ICD-10-CM

## 2023-09-18 LAB — HEPATIC FUNCTION PANEL
ALT: 32 [IU]/L (ref 0–44)
AST: 21 [IU]/L (ref 0–40)
Albumin: 4.2 g/dL (ref 3.8–4.9)
Alkaline Phosphatase: 61 [IU]/L (ref 44–121)
Bilirubin Total: 0.4 mg/dL (ref 0.0–1.2)
Bilirubin, Direct: 0.13 mg/dL (ref 0.00–0.40)
Total Protein: 7.5 g/dL (ref 6.0–8.5)

## 2023-09-18 LAB — TESTOSTERONE: Testosterone: 439 ng/dL (ref 264–916)

## 2023-09-21 ENCOUNTER — Ambulatory Visit: Payer: 59 | Admitting: Urology

## 2023-09-21 VITALS — BP 153/81 | HR 64 | Ht 70.0 in | Wt 192.0 lb

## 2023-09-21 DIAGNOSIS — N401 Enlarged prostate with lower urinary tract symptoms: Secondary | ICD-10-CM | POA: Diagnosis not present

## 2023-09-21 DIAGNOSIS — E291 Testicular hypofunction: Secondary | ICD-10-CM | POA: Diagnosis not present

## 2023-09-21 DIAGNOSIS — R351 Nocturia: Secondary | ICD-10-CM | POA: Diagnosis not present

## 2023-09-21 DIAGNOSIS — N528 Other male erectile dysfunction: Secondary | ICD-10-CM | POA: Diagnosis not present

## 2023-09-21 LAB — BLADDER SCAN AMB NON-IMAGING: Scan Result: 52

## 2023-09-21 NOTE — Progress Notes (Signed)
I,Amy L Pierron,acting as a scribe for Vanna Scotland, MD.,have documented all relevant documentation on the behalf of Vanna Scotland, MD,as directed by  Vanna Scotland, MD while in the presence of Vanna Scotland, MD.  09/21/2023 9:39 AM   Derrick Romero [birth year] [id number]  Referring provider: Erasmo Downer, MD [address] [place],  [place] [address]  Chief Complaint  Patient presents with   Benign Prostatic Hypertrophy    HPI: 62 year-old male with a personal history of hypogonadism, erectile dysfunction, and BPH presents today for a three month follow-up.  He's managed on Flomax for his BPH. At his last visit Cialis daily was added both for erectile dysfunction as well as urinary symptoms.  His testosterone is in the normal range, albeit on the low end. He was started on Clomid with most recent testosterone of 439 and his LFTs were normal.  He mentions not seeing any difference in his urinary symptoms since adding the medication. However, he started taking sleeping pills which have improved his nighttime symptoms.   He hasn't noticed much improvement in his erections since being on Cialis but his ejaculation seems to be significantly reduced.  He has not noticed any improvement since using Clomid, however he is not sure if it also related to time change, getting darker sooner, and not being as active overall.   Results for orders placed or performed in visit on 09/21/23  Bladder Scan (Post Void Residual) in office  Result Value Ref Range   Scan Result 52 ml     IPSS     Row Name 09/21/23 0800         International Prostate Symptom Score   How often have you had the sensation of not emptying your bladder? Not at All     How often have you had to urinate less than every two hours? Less than half the time     How often have you found you stopped and started again several times when you urinated? More than half the time     How often have you found  it difficult to postpone urination? Less than 1 in 5 times     How often have you had a weak urinary stream? Less than half the time     How often have you had to strain to start urination? Not at All     How many times did you typically get up at night to urinate? 1 Time     Total IPSS Score 10       Quality of Life due to urinary symptoms   If you were to spend the rest of your life with your urinary condition just the way it is now how would you feel about that? Mostly Satisfied            Score:  1-7 Mild 8-19 Moderate 20-35 Severe   PMH: Past Medical History:  Diagnosis Date   Allergic rhinitis    Dupuytren's disease     Surgical History: Past Surgical History:  Procedure Laterality Date   COLONOSCOPY WITH PROPOFOL N/A 01/12/2019   Procedure: COLONOSCOPY WITH PROPOFOL;  Surgeon: Pasty Spillers, MD;  Location: [facility] ENDOSCOPY;  Service: Endoscopy;  Laterality: N/A;   ETHMOIDECTOMY     KNEE ARTHROSCOPY      Home Medications:  Allergies as of 09/21/2023       Reactions   Oak Bark  [quercus Robur] Other (See Comments), Shortness Of Breath  Medication List        Accurate as of September 21, 2023  9:39 AM. If you have any questions, ask your nurse or doctor.          STOP taking these medications    sildenafil 100 MG tablet Commonly known as: VIAGRA       TAKE these medications    albuterol 108 (90 Base) MCG/ACT inhaler Commonly known as: VENTOLIN HFA Inhale 2 puffs into the lungs every 6 (six) hours as needed for wheezing or shortness of breath.   azelastine 0.1 % nasal spray Commonly known as: ASTELIN Place 1 spray into both nostrils 2 (two) times daily. Use in each nostril as directed   cetirizine 5 MG tablet Commonly known as: ZYRTEC Take 1 tablet (5 mg total) by mouth in the morning and at bedtime. What changed:  how much to take when to take this   cholecalciferol 25 MCG (1000 UNIT) tablet Commonly known as: VITAMIN  D3 Take 1,000 Units by mouth daily.   clomiPHENE 50 MG tablet Commonly known as: CLOMID Take 0.5 tablets (25 mg total) by mouth daily.   divalproex 500 MG 24 hr tablet Commonly known as: DEPAKOTE ER TAKE 2 TABLETS(1000 MG) BY MOUTH AT BEDTIME   Doxepin HCl 6 MG Tabs Take 0.5-1 tablets (3-6 mg total) by mouth at bedtime.   fluticasone 50 MCG/ACT nasal spray Commonly known as: FLONASE Place 2 sprays into both nostrils daily.   GLUCOSAMINE CHONDR COMPLEX PO Take by mouth.   montelukast 10 MG tablet Commonly known as: SINGULAIR Take 1 tablet (10 mg total) by mouth at bedtime.   ONE DAILY MENS 50+ MULTIVIT PO Take by mouth.   psyllium 58.6 % packet Commonly known as: METAMUCIL Take 1 packet by mouth daily as needed.   tadalafil 5 MG tablet Commonly known as: CIALIS Take 1 tablet (5 mg total) by mouth daily.   tamsulosin 0.4 MG Caps capsule Commonly known as: FLOMAX Take 2 capsules (0.8 mg total) by mouth daily.   vitamin C 100 MG tablet Take 100 mg by mouth daily.        Allergies:  Allergies  Allergen Reactions   Oak Bark  [Quercus Robur] Other (See Comments) and Shortness Of Breath    Family History: Family History  Problem Relation Age of Onset   Asthma Father    Melanoma Father    Cancer Maternal Grandmother    Heart disease Paternal Grandfather    ADD / ADHD Nephew     Social History:  reports that he has never smoked. He has never used smokeless tobacco. He reports current alcohol use of about 6.0 standard drinks of alcohol per week. He reports that he does not use drugs.   Physical Exam: BP (!) 153/81   Pulse 64   Ht 5\' 10"  (1.778 m)   Wt 192 lb (87.1 kg)   BMI 27.55 kg/m   Constitutional:  Alert and oriented, No acute distress. HEENT: [place] AT, moist mucus membranes.  Trachea midline, no masses. Neurologic: Grossly intact, no focal deficits, moving all 4 extremities. Psychiatric: Normal mood and affect.   Assessment & Plan:    1. BPH   - Symptoms are fairly well controlled on Flomax alone. He also admits that sleep disturbance is a contributing factor. Overall his bother is minimal.   - He doesn't think daily Cialis has helped so will discontinue that.   2. Erectile dysfunction  - Responding to Sildenafil as needed.  3. Hypogonadism  - Previously had some borderline low testosterone. His more recent testosterone levels have been normal. He didn't see any marked increase with the addition of Clomid. He also had no real clinical improvement on this. As such will defer additional intervention for this at this time and will return as needed.  Return if symptoms worsen or fail to improve.  I have reviewed the above documentation for accuracy and completeness, and I agree with the above.   Vanna Scotland, MD   [facility] [address] [place], [place] [address] [phone]

## 2023-09-28 ENCOUNTER — Other Ambulatory Visit: Payer: Self-pay | Admitting: Family Medicine

## 2023-09-28 DIAGNOSIS — F3174 Bipolar disorder, in full remission, most recent episode manic: Secondary | ICD-10-CM

## 2023-09-28 DIAGNOSIS — F5104 Psychophysiologic insomnia: Secondary | ICD-10-CM

## 2023-09-28 NOTE — Telephone Encounter (Signed)
Please advise 

## 2023-09-28 NOTE — Telephone Encounter (Signed)
[facility]--regarding this medication.

## 2023-10-08 ENCOUNTER — Telehealth: Payer: Self-pay | Admitting: Family Medicine

## 2023-10-08 NOTE — Telephone Encounter (Signed)
Pt called isays needs PA for, Doxepin HCl 6 MG TABS

## 2023-11-15 ENCOUNTER — Ambulatory Visit: Payer: Self-pay | Admitting: Psychiatry

## 2023-11-18 ENCOUNTER — Encounter: Payer: Self-pay | Admitting: Behavioral Health

## 2023-11-18 ENCOUNTER — Ambulatory Visit: Payer: 59 | Admitting: Behavioral Health

## 2023-11-18 DIAGNOSIS — Z79899 Other long term (current) drug therapy: Secondary | ICD-10-CM | POA: Diagnosis not present

## 2023-11-18 DIAGNOSIS — F3177 Bipolar disorder, in partial remission, most recent episode mixed: Secondary | ICD-10-CM | POA: Diagnosis not present

## 2023-11-18 DIAGNOSIS — R5383 Other fatigue: Secondary | ICD-10-CM | POA: Diagnosis not present

## 2023-11-18 DIAGNOSIS — G4700 Insomnia, unspecified: Secondary | ICD-10-CM

## 2023-11-18 NOTE — Progress Notes (Signed)
[facility]  Patient ID: Derrick Romero,  MRN: [id number]  PCP: Erasmo Downer, MD  Date of Evaluation: 11/18/2023 Time spent:30 minutes  Chief Complaint:  Chief Complaint   Anxiety; Depression; Manic Behavior; Follow-up; Patient Education     HISTORY/CURRENT STATUS: HPI  "Derrick Romero", 63 year old male presents to this office for follow-up and medication management.  He is a previous patient of Corie Chiquito at this practice.  He says that he has been doing well overall and that Depakote continues to help him maintain good stability.  He still has concerns about sleep which has been managed by his PCP.  He is trying to decide whether he wants to initiate doxepin or not.  Says that due to prostate enlargement he has to get up several times per night which interrupts his sleeping pattern.  Says that he will follow-up with PCP due to insurance concerns and cost.  He is currently still estranged from his wife who is living with her brother.  Says they are not legally separated but have decided to live apart since 2020.  Reports some decreased energy levels during the winter months but says he normally improves when it starts to get warmer with more daylight hours.  He denies any recent mania or depression.  Rates depression levels today at 2/10 and anxiety at 1/10.  Is sleeping 5 to 6 hours per night on average.  Denies mania, no psychosis, no auditory or visual hallucinations.  Denies SI or HI.  Past psychiatric medication trials: Depakote Trazodone Seroquel      Individual Medical History/ Review of Systems: Changes? :No   Allergies: Oak bark  [quercus robur]  Current Medications:  Current Outpatient Medications:    albuterol (VENTOLIN HFA) 108 (90 Base) MCG/ACT inhaler, Inhale 2 puffs into the lungs every 6 (six) hours as needed for wheezing or shortness of breath., Disp: 54 g, Rfl: 1   Ascorbic Acid (VITAMIN C) 100 MG tablet, Take 100 mg by mouth daily., Disp: , Rfl:     azelastine (ASTELIN) 0.1 % nasal spray, Place 1 spray into both nostrils 2 (two) times daily. Use in each nostril as directed, Disp: 30 mL, Rfl: 1   cetirizine (ZYRTEC) 5 MG tablet, Take 1 tablet (5 mg total) by mouth in the morning and at bedtime. (Patient taking differently: Take 10 mg by mouth at bedtime.), Disp: 180 tablet, Rfl: 1   cholecalciferol (VITAMIN D3) 25 MCG (1000 UT) tablet, Take 1,000 Units by mouth daily., Disp: , Rfl:    divalproex (DEPAKOTE ER) 500 MG 24 hr tablet, TAKE 2 TABLETS(1000 MG) BY MOUTH AT BEDTIME, Disp: 180 tablet, Rfl: 1   Doxepin HCl 6 MG TABS, Take 0.5-1 tablets (3-6 mg total) by mouth at bedtime., Disp: 30 tablet, Rfl: 2   fluticasone (FLONASE) 50 MCG/ACT nasal spray, Place 2 sprays into both nostrils daily., Disp: 16 g, Rfl: 6   Glucosamine-Chondroitin (GLUCOSAMINE CHONDR COMPLEX PO), Take by mouth., Disp: , Rfl:    montelukast (SINGULAIR) 10 MG tablet, Take 1 tablet (10 mg total) by mouth at bedtime., Disp: 30 tablet, Rfl: 3   Multiple Vitamins-Minerals (ONE DAILY MENS 50+ MULTIVIT PO), Take by mouth., Disp: , Rfl:    psyllium (METAMUCIL) 58.6 % packet, Take 1 packet by mouth daily as needed., Disp: , Rfl:    tamsulosin (FLOMAX) 0.4 MG CAPS capsule, Take 2 capsules (0.8 mg total) by mouth daily., Disp: 180 capsule, Rfl: 1 Medication Side Effects: none  Family Medical/ Social History: Changes?  No  MENTAL HEALTH EXAM:  There were no vitals taken for this visit.There is no height or weight on file to calculate BMI.  General Appearance: Casual, Neat, and Well Groomed  Eye Contact:  Good  Speech:  Clear and Coherent  Volume:  Normal  Mood:  NA  Affect:  Appropriate  Thought Process:  Coherent  Orientation:  Full (Time, Place, and Person)  Thought Content: Logical   Suicidal Thoughts:  No  Homicidal Thoughts:  No  Memory:  WNL  Judgement:  Good  Insight:  Good  Psychomotor Activity:  Normal  Concentration:  Concentration: Good  Recall:  Good  Fund  of Knowledge: Good  Language: Good  Assets:  Desire for Improvement  ADL's:  Intact  Cognition: WNL  Prognosis:  Good    DIAGNOSES:    ICD-10-CM   1. Bipolar disorder, in partial remission, most recent episode mixed (HCC)  F31.77     2. Insomnia, unspecified type  G47.00     3. High risk medication use  Z79.899     4. Fatigue, unspecified type  R53.83       Receiving Psychotherapy: No    RECOMMENDATIONS:   Greater than 50% of 30 min  face to face time with patient was spent on counseling and coordination of care. We discussed his diagnosis of bipolar in 2020 after an episode of anger reaction with spouse leading to observation and behavioral health.  We talked about his previous plan of care with Corie Chiquito as well as his PCP.  Says that he does follow-up with labs regularly with PCP and will do so with Depakote levels during annual checkup.  He would like to continue on current medication regimen with making no changes.  We agreed today to:  Will continue Depakote 1000 mg ER at bedtime daily To follow-up with PCP about concerns with doxepin for sleep Will report worsening symptoms or side effects promptly To follow-up with this office in 6 months as requested by PT provided emergency contact information Agrees that he will follow-up with to obtain Depakote levels during his annual checkup. Reviewed PDMP      Joan Flores, NP

## 2023-11-18 NOTE — Progress Notes (Deleted)
 Marland Kitchen

## 2023-11-27 ENCOUNTER — Other Ambulatory Visit: Payer: Self-pay | Admitting: Physician Assistant

## 2023-11-27 DIAGNOSIS — J0110 Acute frontal sinusitis, unspecified: Secondary | ICD-10-CM

## 2023-12-10 ENCOUNTER — Ambulatory Visit: Payer: 59 | Admitting: Family Medicine

## 2023-12-10 ENCOUNTER — Encounter: Payer: Self-pay | Admitting: Family Medicine

## 2023-12-10 VITALS — BP 136/70 | HR 61 | Ht 70.0 in | Wt 196.0 lb

## 2023-12-10 DIAGNOSIS — Z23 Encounter for immunization: Secondary | ICD-10-CM | POA: Diagnosis not present

## 2023-12-10 DIAGNOSIS — F3174 Bipolar disorder, in full remission, most recent episode manic: Secondary | ICD-10-CM

## 2023-12-10 DIAGNOSIS — R351 Nocturia: Secondary | ICD-10-CM

## 2023-12-10 DIAGNOSIS — F5104 Psychophysiologic insomnia: Secondary | ICD-10-CM

## 2023-12-10 DIAGNOSIS — N401 Enlarged prostate with lower urinary tract symptoms: Secondary | ICD-10-CM

## 2023-12-10 DIAGNOSIS — E782 Mixed hyperlipidemia: Secondary | ICD-10-CM | POA: Diagnosis not present

## 2023-12-10 DIAGNOSIS — R7303 Prediabetes: Secondary | ICD-10-CM | POA: Diagnosis not present

## 2023-12-10 MED ORDER — DOXEPIN HCL 10 MG PO CAPS: 10.0000 mg | ORAL_CAPSULE | Freq: Every day | ORAL | 1 refills | Status: AC

## 2023-12-10 NOTE — Assessment & Plan Note (Signed)
Stable A1C at 5.8, consistent with previous readings. -Continue lifestyle modifications including diet and exercise. -Recheck A1C in 6 months.

## 2023-12-10 NOTE — Assessment & Plan Note (Signed)
Slightly elevated cholesterol with intermediate 10-year risk of heart attack and stroke. -Continue lifestyle modifications including diet and exercise. -Recheck cholesterol in 6 months.

## 2023-12-10 NOTE — Assessment & Plan Note (Signed)
 Chronic insomnia managed with medication. Previous medication was Seroquel , now considering doxepin . Patient prefers not to cut pills. Discussed cost-effectiveness of 10 mg doxepin  versus 6 mg. If doxepin  is ineffective, Seroquel  can be reconsidered. - Prescribe doxepin  10 mg - Follow up with psychiatrist if doxepin  is ineffective

## 2023-12-10 NOTE — Assessment & Plan Note (Signed)
 Nocturia managed with tamsulosin  (Flomax ). Previous trial of Clomid  was ineffective. - Continue tamsulosin  (Flomax ) as prescribed - Monitor symptoms and follow up if condition worsens - f/b Urology

## 2023-12-10 NOTE — Progress Notes (Signed)
 Established patient visit   Patient: Derrick Romero   DOB: [birth year]   63 y.o. Male  MRN: [id number] Visit Date: 12/10/2023  Today's healthcare provider: Jon Eva, MD   Chief Complaint  Patient presents with   Medical Management of Chronic Issues    Pot reports to e taking all medications as prescribed with no side effects or symptoms to report   Hyperlipidemia    No rx. Dietary changes. No symptoms to report   Diabetes    No rx. Detary changes. No symptoms to report   Subjective    HPI HPI     Medical Management of Chronic Issues    Additional comments: Pot reports to e taking all medications as prescribed with no side effects or symptoms to report        Hyperlipidemia    Additional comments: No rx. Dietary changes. No symptoms to report        Diabetes    Additional comments: No rx. Detary changes. No symptoms to report      Last edited by Lilian Fitzpatrick, CMA on 12/10/2023  8:25 AM.       Discussed the use of AI scribe software for clinical note transcription with the patient, who gave verbal consent to proceed.  History of Present Illness   He reports ongoing issues with sleep, which he attributes to a lack of sleeping medication. He has been in discussion with his psychiatrist about this issue and has been considering switching from Seroquel  to a smaller dose of doxepin . He previously tried Clomid  for this issue, but it did not seem to make a difference in his symptoms or labs, so he discontinued it. The patient also mentions that he has not been exercising as much as he would like due to a busy schedule and poor weather. He expresses a desire to get back into a regular exercise routine. The patient is also due for several vaccinations, including the flu shot and pneumonia vaccine, and is considering getting the RSV vaccine and a new COVID booster.         Medications: Outpatient Medications Prior to Visit  Medication Sig   albuterol   (VENTOLIN  HFA) 108 (90 Base) MCG/ACT inhaler Inhale 2 puffs into the lungs every 6 (six) hours as needed for wheezing or shortness of breath.   Ascorbic Acid (VITAMIN C) 100 MG tablet Take 100 mg by mouth daily.   azelastine  (ASTELIN ) 0.1 % nasal spray Place 1 spray into both nostrils 2 (two) times daily. Use in each nostril as directed   cetirizine  (ZYRTEC ) 5 MG tablet Take 1 tablet (5 mg total) by mouth in the morning and at bedtime. (Patient taking differently: Take 10 mg by mouth at bedtime.)   cholecalciferol (VITAMIN D3) 25 MCG (1000 UT) tablet Take 1,000 Units by mouth daily.   divalproex  (DEPAKOTE  ER) 500 MG 24 hr tablet TAKE 2 TABLETS(1000 MG) BY MOUTH AT BEDTIME   fluticasone  (FLONASE ) 50 MCG/ACT nasal spray Place 2 sprays into both nostrils daily.   Glucosamine-Chondroitin (GLUCOSAMINE CHONDR COMPLEX PO) Take by mouth.   montelukast  (SINGULAIR ) 10 MG tablet Take 1 tablet (10 mg total) by mouth at bedtime.   Multiple Vitamins-Minerals (ONE DAILY MENS 50+ MULTIVIT PO) Take by mouth.   psyllium (METAMUCIL) 58.6 % packet Take 1 packet by mouth daily as needed.   tamsulosin  (FLOMAX ) 0.4 MG CAPS capsule Take 2 capsules (0.8 mg total) by mouth daily.   [DISCONTINUED] Doxepin  HCl 6 MG TABS  Take 0.5-1 tablets (3-6 mg total) by mouth at bedtime.   No facility-administered medications prior to visit.    Review of Systems     Objective    BP 136/70 (BP Location: Left Arm, Patient Position: Sitting, Cuff Size: Large)   Pulse 61   Ht 5' 10 (1.778 m)   Wt 196 lb (88.9 kg)   SpO2 100%   BMI 28.12 kg/m    Physical Exam Vitals reviewed.  Constitutional:      General: He is not in acute distress.    Appearance: Normal appearance. He is not diaphoretic.  HENT:     Head: Normocephalic and atraumatic.  Eyes:     General: No scleral icterus.    Conjunctiva/sclera: Conjunctivae normal.  Cardiovascular:     Rate and Rhythm: Normal rate and regular rhythm.     Heart sounds: Normal heart  sounds. No murmur heard. Pulmonary:     Effort: Pulmonary effort is normal. No respiratory distress.     Breath sounds: Normal breath sounds. No wheezing or rhonchi.  Musculoskeletal:     Cervical back: Neck supple.     Right lower leg: No edema.     Left lower leg: No edema.  Lymphadenopathy:     Cervical: No cervical adenopathy.  Skin:    General: Skin is warm and dry.     Findings: No rash.  Neurological:     Mental Status: He is alert and oriented to person, place, and time. Mental status is at baseline.  Psychiatric:        Mood and Affect: Mood normal.        Behavior: Behavior normal.      No results found for any visits on 12/10/23.  Assessment & Plan     Problem List Items Addressed This Visit       Other   Prediabetes   Stable A1C at 5.8, consistent with previous readings. -Continue lifestyle modifications including diet and exercise. -Recheck A1C in 6 months.      Relevant Orders   Hemoglobin A1c   Benign prostatic hyperplasia with nocturia   Nocturia managed with tamsulosin  (Flomax ). Previous trial of Clomid  was ineffective. - Continue tamsulosin  (Flomax ) as prescribed - Monitor symptoms and follow up if condition worsens - f/b Urology      Psychophysiological insomnia   Chronic insomnia managed with medication. Previous medication was Seroquel , now considering doxepin . Patient prefers not to cut pills. Discussed cost-effectiveness of 10 mg doxepin  versus 6 mg. If doxepin  is ineffective, Seroquel  can be reconsidered. - Prescribe doxepin  10 mg - Follow up with psychiatrist if doxepin  is ineffective      Bipolar 1 disorder, manic, full remission (HCC)   Stable on Depakote . -Continue Depakote  as prescribed. - continue to follow with Psych      Hyperlipidemia - Primary   Slightly elevated cholesterol with intermediate 10-year risk of heart attack and stroke. -Continue lifestyle modifications including diet and exercise. -Recheck cholesterol in 6  months.      Relevant Orders   Comprehensive metabolic panel   Lipid panel   Other Visit Diagnoses       Influenza vaccine needed       Relevant Orders   Flu vaccine trivalent PF, 6mos and older(Flulaval,Afluria,Fluarix,Fluzone)     Need for pneumococcal vaccine       Relevant Orders   Pneumococcal conjugate vaccine 20-valent           Bipolar Disorder Bipolar disorder well-managed with Depakote . No changes  in symptoms reported. - Continue Depakote  as prescribed  Elevated BP Blood pressure slightly elevated but improved from last visit (136/70 mmHg). Patient acknowledges minimal exercise and increased stress due to personal events. Encouraged to resume regular exercise. - Recheck blood pressure - Encourage regular exercise and dietary modifications  General Health Maintenance Patient is due for several vaccinations and screenings. Discussed importance of flu, pneumonia, RSV, and COVID vaccinations. Labs for A1c, kidney and liver function, and cholesterol ordered. - Administer flu shot - Administer pneumonia (Prevnar) shot - Recommend RSV vaccine (Arexvy) at pharmacy - Recommend updated COVID booster at pharmacy - Order labs for A1c, kidney and liver function, and cholesterol - Schedule six-month physical  Follow-up - Follow up with psychiatrist regarding doxepin  efficacy - Schedule six-month physical - Follow up on lab results.       Return in about 6 months (around 06/08/2024) for CPE.       Jon Eva, MD  [facility] [phone] (phone) [phone] (fax)  [facility]

## 2023-12-10 NOTE — Assessment & Plan Note (Signed)
Stable on Depakote. -Continue Depakote as prescribed. - continue to follow with Psych

## 2023-12-11 LAB — COMPREHENSIVE METABOLIC PANEL
ALT: 30 [IU]/L (ref 0–44)
AST: 23 [IU]/L (ref 0–40)
Albumin: 4.5 g/dL (ref 3.8–4.9)
Alkaline Phosphatase: 63 [IU]/L (ref 44–121)
BUN/Creatinine Ratio: 12 (ref 10–24)
BUN: 10 mg/dL (ref 8–27)
Bilirubin Total: 0.3 mg/dL (ref 0.0–1.2)
CO2: 23 mmol/L (ref 20–29)
Calcium: 9.8 mg/dL (ref 8.6–10.2)
Chloride: 97 mmol/L (ref 96–106)
Creatinine, Ser: 0.85 mg/dL (ref 0.76–1.27)
Globulin, Total: 2.9 g/dL (ref 1.5–4.5)
Glucose: 99 mg/dL (ref 70–99)
Potassium: 4.7 mmol/L (ref 3.5–5.2)
Sodium: 136 mmol/L (ref 134–144)
Total Protein: 7.4 g/dL (ref 6.0–8.5)
eGFR: 99 mL/min/{1.73_m2} (ref >59)

## 2023-12-11 LAB — LIPID PANEL
Chol/HDL Ratio: 4.6 {ratio} (ref 0.0–5.0)
Cholesterol, Total: 231 mg/dL — ABNORMAL HIGH (ref 100–199)
HDL: 50 mg/dL (ref >39)
LDL Chol Calc (NIH): 147 mg/dL — ABNORMAL HIGH (ref 0–99)
Triglycerides: 186 mg/dL — ABNORMAL HIGH (ref 0–149)
VLDL Cholesterol Cal: 34 mg/dL (ref 5–40)

## 2023-12-11 LAB — HEMOGLOBIN A1C
Est. average glucose Bld gHb Est-mCnc: 114 mg/dL
Hgb A1c MFr Bld: 5.6 % (ref 4.8–5.6)

## 2023-12-13 ENCOUNTER — Encounter: Payer: Self-pay | Admitting: Family Medicine

## 2024-01-02 ENCOUNTER — Other Ambulatory Visit: Payer: Self-pay | Admitting: Family Medicine

## 2024-01-04 NOTE — Telephone Encounter (Signed)
  Requested Prescriptions  Pending Prescriptions Disp Refills   tamsulosin (FLOMAX) 0.4 MG CAPS capsule [Pharmacy Med Name: TAMSULOSIN 0.4MG  CAPSULES] 180 capsule 0    Sig: TAKE 2 CAPSULES(0.8 MG) BY MOUTH DAILY     Urology: Alpha-Adrenergic Blocker Passed - 01/04/2024  8:59 AM      Passed - PSA in normal range and within 360 days    Prostate Specific Ag, Serum  Date Value Ref Range Status  05/28/2023 1.4 0.0 - 4.0 ng/mL Final    Comment:    Roche ECLIA methodology. According to the American Urological Association, Serum PSA should decrease and remain at undetectable levels after radical prostatectomy. The AUA defines biochemical recurrence as an initial PSA value 0.2 ng/mL or greater followed by a subsequent confirmatory PSA value 0.2 ng/mL or greater. Values obtained with different assay methods or kits cannot be used interchangeably. Results cannot be interpreted as absolute evidence of the presence or absence of malignant disease.          Passed - Last BP in normal range    BP Readings from Last 1 Encounters:  12/10/23 136/70         Passed - Valid encounter within last 12 months    Recent Outpatient Visits           7 months ago Encounter for annual physical exam   [facility] [place], Marzella Schlein, MD   7 months ago Psychophysiological insomnia   [facility] Merita Norton T, FNP   9 months ago Seasonal allergic rhinitis due to pollen   [facility], Marzella Schlein, MD   11 months ago Seasonal allergic rhinitis due to pollen   [facility] Alfredia Ferguson, PA-C   11 months ago Acute non-recurrent frontal sinusitis   [facility] Alfredia Ferguson, [place]

## 2024-02-15 ENCOUNTER — Telehealth: Payer: Self-pay | Admitting: Behavioral Health

## 2024-02-15 DIAGNOSIS — F3177 Bipolar disorder, in partial remission, most recent episode mixed: Secondary | ICD-10-CM

## 2024-02-15 MED ORDER — DIVALPROEX SODIUM ER 500 MG PO TB24: ORAL_TABLET | ORAL | 0 refills | Status: DC

## 2024-02-15 NOTE — Telephone Encounter (Signed)
 Derrick Romero called at 3:37 to request refill of his Depakote 500mg .  Appt 7/16.  Was pt of JC.  Send to  [id number] - [place], [place] - [address]

## 2024-02-15 NOTE — Telephone Encounter (Signed)
 Depakote 500 mg x2 sent to [facility].

## 2024-04-06 ENCOUNTER — Other Ambulatory Visit: Payer: Self-pay | Admitting: Family Medicine

## 2024-05-17 ENCOUNTER — Ambulatory Visit (INDEPENDENT_AMBULATORY_CARE_PROVIDER_SITE_OTHER): Payer: 59 | Admitting: Behavioral Health

## 2024-05-17 ENCOUNTER — Encounter: Payer: Self-pay | Admitting: Behavioral Health

## 2024-05-17 DIAGNOSIS — F3177 Bipolar disorder, in partial remission, most recent episode mixed: Secondary | ICD-10-CM | POA: Diagnosis not present

## 2024-05-17 DIAGNOSIS — Z79899 Other long term (current) drug therapy: Secondary | ICD-10-CM | POA: Diagnosis not present

## 2024-05-17 MED ORDER — DIVALPROEX SODIUM ER 500 MG PO TB24: ORAL_TABLET | ORAL | 2 refills | Status: DC

## 2024-05-17 MED ORDER — QUETIAPINE FUMARATE 50 MG PO TABS: 50.0000 mg | ORAL_TABLET | Freq: Every day | ORAL | 2 refills | Status: DC

## 2024-05-17 NOTE — Progress Notes (Signed)
 [facility]  Patient ID: Derrick Romero,  MRN: [id number]  PCP: Derrick Jon CHRISTELLA, MD  Date of Evaluation: 05/17/2024 Time spent:20 minutes  Chief Complaint:  Chief Complaint   Anxiety; Depression; Follow-up; Medication Refill; Patient Education; Insomnia     HISTORY/CURRENT STATUS: HPI Derrick Romero, 63 year old male presents to this office for follow-up and medication management.  He is a previous patient of Derrick Romero at this practice.  We have seen each other one previous visit. He says he has been doing very well overall. Expecting some career changes and preparing to sell home, and move to [place] area.  Some apprehension due to unknown life variables.  Medications continue to work well. He is requesting a restart on Seroquel  which worked well for sleep.   He denies any recent mania or depression.  Rates depression levels today at 2/10 and anxiety at 1/10.  Is sleeping 5 to 6 hours per night on average.  Denies mania, no psychosis, no auditory or visual hallucinations.  Denies SI or HI.   Past psychiatric medication trials: Depakote  Trazodone  Seroquel        Individual Medical History/ Review of Systems: Changes? :No   Allergies: Oak bark  [quercus robur]  Current Medications:  Current Outpatient Medications:    QUEtiapine  (SEROQUEL ) 50 MG tablet, Take 1 tablet (50 mg total) by mouth at bedtime., Disp: 90 tablet, Rfl: 2   albuterol  (VENTOLIN  HFA) 108 (90 Base) MCG/ACT inhaler, Inhale 2 puffs into the lungs every 6 (six) hours as needed for wheezing or shortness of breath., Disp: 54 g, Rfl: 1   Ascorbic Acid (VITAMIN C) 100 MG tablet, Take 100 mg by mouth daily., Disp: , Rfl:    azelastine  (ASTELIN ) 0.1 % nasal spray, Place 1 spray into both nostrils 2 (two) times daily. Use in each nostril as directed, Disp: 30 mL, Rfl: 1   cetirizine  (ZYRTEC ) 5 MG tablet, Take 1 tablet (5 mg total) by mouth in the morning and at bedtime. (Patient taking differently: Take 10  mg by mouth at bedtime.), Disp: 180 tablet, Rfl: 1   cholecalciferol (VITAMIN D3) 25 MCG (1000 UT) tablet, Take 1,000 Units by mouth daily., Disp: , Rfl:    divalproex  (DEPAKOTE  ER) 500 MG 24 hr tablet, TAKE 2 TABLETS(1000 MG) BY MOUTH AT BEDTIME, Disp: 180 tablet, Rfl: 2   doxepin  (SINEQUAN ) 10 MG capsule, Take 1 capsule (10 mg total) by mouth at bedtime., Disp: 90 capsule, Rfl: 1   fluticasone  (FLONASE ) 50 MCG/ACT nasal spray, Place 2 sprays into both nostrils daily., Disp: 16 g, Rfl: 6   Glucosamine-Chondroitin (GLUCOSAMINE CHONDR COMPLEX PO), Take by mouth., Disp: , Rfl:    montelukast  (SINGULAIR ) 10 MG tablet, Take 1 tablet (10 mg total) by mouth at bedtime., Disp: 30 tablet, Rfl: 3   Multiple Vitamins-Minerals (ONE DAILY MENS 50+ MULTIVIT PO), Take by mouth., Disp: , Rfl:    psyllium (METAMUCIL) 58.6 % packet, Take 1 packet by mouth daily as needed., Disp: , Rfl:    tamsulosin  (FLOMAX ) 0.4 MG CAPS capsule, TAKE 2 CAPSULES(0.8 MG) BY MOUTH DAILY, Disp: 180 capsule, Rfl: 0 Medication Side Effects: none  Family Medical/ Social History: Changes? No  MENTAL HEALTH EXAM:  There were no vitals taken for this visit.There is no height or weight on file to calculate BMI.  General Appearance: Casual  Eye Contact:  Good  Speech:  Clear and Coherent  Volume:  Normal  Mood:  NA  Affect:  Appropriate  Thought Process:  Coherent  Orientation:  Full (Time, Place, and Person)  Thought Content: Logical   Suicidal Thoughts:  No  Homicidal Thoughts:  No  Memory:  WNL  Judgement:  Good  Insight:  Good  Psychomotor Activity:  Normal  Concentration:  Concentration: Good  Recall:  Good  Fund of Knowledge: Good  Language: Good  Assets:  Desire for Improvement  ADL's:  Intact  Cognition: WNL  Prognosis:  Good    DIAGNOSES:    ICD-10-CM   1. High risk medication use  Z79.899 Valproic acid  level    2. Bipolar disorder, in partial remission, most recent episode mixed (HCC)  F31.77 divalproex   (DEPAKOTE  ER) 500 MG 24 hr tablet    QUEtiapine  (SEROQUEL ) 50 MG tablet      Receiving Psychotherapy: No    RECOMMENDATIONS:  Greater than 50% of 30 min  face to face time with patient was spent on counseling and coordination of care. We discussed his diagnosis of bipolar in 2020 after an episode of anger reaction with spouse leading to observation and behavioral health.  We talked about his previous plan of care with Derrick Romero as well as his PCP.  Says that he does follow-up with labs regularly with PCP and will do so with Depakote  levels during annual checkup.  He would like to continue on current medication regimen with making no changes.   We agreed today to:  Ordered Depakote  Levels today To start Seroquel  50 mg daily at bedtime Will continue Depakote  1000 mg ER at bedtime daily To follow-up with PCP about concerns with doxepin  for sleep Will report worsening symptoms or side effects promptly To follow-up with this office in 6 months as requested by PT provided emergency contact information Agrees that he will follow-up with to obtain Depakote  levels during his annual checkup. Discussed potential metabolic side effects associated with atypical antipsychotics, as well as potential risk for movement side effects. Advised pt to contact office if movement side effects occur.   Reviewed PDMP         Derrick DELENA Pizza, NP

## 2024-05-30 ENCOUNTER — Other Ambulatory Visit: Payer: Self-pay | Admitting: Family Medicine

## 2024-08-03 ENCOUNTER — Telehealth: Payer: Self-pay | Admitting: Family Medicine

## 2024-08-03 DIAGNOSIS — J301 Allergic rhinitis due to pollen: Secondary | ICD-10-CM

## 2024-08-03 NOTE — Telephone Encounter (Signed)
 [facility] faxed refill request for the following medications:  cetirizine  (ZYRTEC ) 5 MG tablet    Please advise.

## 2024-08-04 MED ORDER — CETIRIZINE HCL 5 MG PO TABS: 10.0000 mg | ORAL_TABLET | Freq: Every day | ORAL | 3 refills | Status: DC

## 2024-10-17 ENCOUNTER — Telehealth: Payer: Self-pay | Admitting: Family Medicine

## 2024-10-17 MED ORDER — TAMSULOSIN HCL 0.4 MG PO CAPS: 0.4000 mg | ORAL_CAPSULE | Freq: Every day | ORAL | 0 refills | Status: AC

## 2024-10-17 NOTE — Telephone Encounter (Signed)
[facility] faxed refill request for the following medications:  tamsulosin (FLOMAX) 0.4 MG CAPS capsule   Please advise.

## 2024-11-14 ENCOUNTER — Ambulatory Visit: Admitting: Behavioral Health

## 2024-11-20 ENCOUNTER — Encounter: Payer: Self-pay | Admitting: Family Medicine

## 2024-11-20 ENCOUNTER — Ambulatory Visit: Admitting: Family Medicine

## 2024-11-20 VITALS — BP 140/75 | HR 74 | Resp 16 | Ht 69.0 in | Wt 205.0 lb

## 2024-11-20 DIAGNOSIS — H6123 Impacted cerumen, bilateral: Secondary | ICD-10-CM | POA: Diagnosis not present

## 2024-11-20 DIAGNOSIS — J301 Allergic rhinitis due to pollen: Secondary | ICD-10-CM

## 2024-11-20 DIAGNOSIS — R052 Subacute cough: Secondary | ICD-10-CM

## 2024-11-20 MED ORDER — ALBUTEROL SULFATE HFA 108 (90 BASE) MCG/ACT IN AERS: 2.0000 | INHALATION_SPRAY | Freq: Four times a day (QID) | RESPIRATORY_TRACT | 1 refills | Status: AC | PRN

## 2024-11-20 MED ORDER — CETIRIZINE HCL 5 MG PO TABS: 10.0000 mg | ORAL_TABLET | Freq: Every day | ORAL | 3 refills | Status: AC

## 2024-11-20 NOTE — Progress Notes (Signed)
 "     Established patient visit   Patient: Derrick Romero   DOB: Dec 09, 1960   64 y.o. Male  MRN: [id number] Visit Date: 11/20/2024  Today's healthcare provider: Nancyann Perry, MD   Chief Complaint  Patient presents with   Acute Visit    Clogged ear (x1 w)   Subjective    Discussed the use of AI scribe software for clinical note transcription with the patient, who gave verbal consent to proceed.  History of Present Illness   Derrick Romero is a 64 year old male who presents with left ear discomfort and hearing loss.  He has been experiencing a slight earache in his left ear since last Monday, followed by a loss of hearing. He used an ear cleaner over the weekend, which removed a fair amount of wax, but the ear still feels uncomfortable. He sleeps with earplugs, which he believes contributes to the wax buildup.  He has a persistent cough for about four weeks. Over-the-counter medications have been used intermittently, providing temporary relief but not complete resolution. The cough is more pronounced at night, causing him to cough for about half an hour after lying down. He experiences wheezing when slowing his breathing down before sleep. He recalls initial symptoms of sinus congestion and a sore throat. He currently uses Mucinex cold and cough tablets, which he took last week when symptoms were more prevalent.  He reports no current sore throat, though he had a sore throat at the onset of his symptoms. He reports feeling congestion in his chest and wheezing at night.       Medications: Show/hide medication list[1]      Objective    BP (!) 140/75 (BP Location: Left Arm, Patient Position: Sitting, Cuff Size: Large)   Pulse 74   Resp 16   Ht 5' 9 (1.753 m)   Wt 205 lb (93 kg)   SpO2 99%   BMI 30.27 kg/m   Physical Exam   General Appearance:    Well developed, well nourished male, alert, cooperative, in no acute distress  HENT:   neck without nodes, sinuses nontender,  and post nasal drip noted. Excessive cerumen bilaterally, but not  impacted.   Eyes:    PERRL, conjunctiva/corneas clear, EOM's intact       Lungs:     Clear to auscultation bilaterally, rare expiratory wheeze, respirations unlabored  Heart:    Normal heart rate. Normal rhythm. No murmurs, rubs, or gallops.    Neurologic:   Awake, alert, oriented x 3. No apparent focal neurological           defect.       Assessment & Plan    1. Excessive cerumen in both ear canals (Primary) He has managed to disimpact his ear canals with home ear irrigation system. He does wear ear plugs every night and advised he probably needs to use Debrox a few times a week to prevent wax from building up in canals.  2. Subacute cough Improving since getting over URI a few weeks ago. refill- albuterol  (VENTOLIN  HFA) 108 (90 Base) MCG/ACT inhaler; Inhale 2 puffs into the lungs every 6 (six) hours as needed for wheezing or shortness of breath.  Dispense: 54 g; Refill: 1  3. Seasonal allergic rhinitis due to pollen refill- cetirizine  (ZYRTEC ) 5 MG tablet; Take 2 tablets (10 mg total) by mouth at bedtime.  Dispense: 90 tablet; Refill: 3      Nancyann Perry, MD  [facility] [phone] (phone) [phone] (fax)  [facility]    [1]  Outpatient Medications Prior to Visit  Medication Sig   Ascorbic Acid (VITAMIN C) 100 MG tablet Take 100 mg by mouth daily.   azelastine  (ASTELIN ) 0.1 % nasal spray Place 1 spray into both nostrils 2 (two) times daily. Use in each nostril as directed   cholecalciferol (VITAMIN D3) 25 MCG (1000 UT) tablet Take 1,000 Units by mouth daily.   divalproex  (DEPAKOTE  ER) 500 MG 24 hr tablet TAKE 2 TABLETS(1000 MG) BY MOUTH AT BEDTIME   doxepin  (SINEQUAN ) 10 MG capsule Take 1 capsule (10 mg total) by mouth at bedtime. (Patient not taking: Reported on 11/20/2024)   fluticasone  (FLONASE ) 50 MCG/ACT nasal spray Place 2 sprays into both nostrils daily.  (Patient not taking: Reported on 11/20/2024)   Glucosamine-Chondroitin (GLUCOSAMINE CHONDR COMPLEX PO) Take by mouth.   montelukast  (SINGULAIR ) 10 MG tablet Take 1 tablet (10 mg total) by mouth at bedtime. (Patient not taking: Reported on 11/20/2024)   Multiple Vitamins-Minerals (ONE DAILY MENS 50+ MULTIVIT PO) Take by mouth.   psyllium (METAMUCIL) 58.6 % packet Take 1 packet by mouth daily as needed.   QUEtiapine  (SEROQUEL ) 50 MG tablet Take 1 tablet (50 mg total) by mouth at bedtime.   tamsulosin  (FLOMAX ) 0.4 MG CAPS capsule Take 1 capsule (0.4 mg total) by mouth daily. (Patient not taking: Reported on 11/20/2024)   [DISCONTINUED] albuterol  (VENTOLIN  HFA) 108 (90 Base) MCG/ACT inhaler Inhale 2 puffs into the lungs every 6 (six) hours as needed for wheezing or shortness of breath.   [DISCONTINUED] cetirizine  (ZYRTEC ) 5 MG tablet Take 2 tablets (10 mg total) by mouth at bedtime.   No facility-administered medications prior to visit.   "

## 2024-12-04 ENCOUNTER — Telehealth: Admitting: Behavioral Health

## 2024-12-04 ENCOUNTER — Encounter: Payer: Self-pay | Admitting: Behavioral Health

## 2024-12-04 DIAGNOSIS — Z79899 Other long term (current) drug therapy: Secondary | ICD-10-CM

## 2024-12-04 DIAGNOSIS — R5383 Other fatigue: Secondary | ICD-10-CM

## 2024-12-04 DIAGNOSIS — G4700 Insomnia, unspecified: Secondary | ICD-10-CM

## 2024-12-04 DIAGNOSIS — F3177 Bipolar disorder, in partial remission, most recent episode mixed: Secondary | ICD-10-CM

## 2024-12-04 MED ORDER — QUETIAPINE FUMARATE 50 MG PO TABS: 50.0000 mg | ORAL_TABLET | Freq: Every day | ORAL | 2 refills | Status: AC

## 2024-12-04 MED ORDER — DIVALPROEX SODIUM ER 500 MG PO TB24: ORAL_TABLET | ORAL | 2 refills | Status: AC

## 2024-12-04 NOTE — Addendum Note (Signed)
 Addended by: TERESA ROGUE A on: 12/04/2024 10:06 AM   Modules accepted: Level of Service
